# Patient Record
Sex: Female | Born: 1950 | Race: White | Hispanic: No | Smoking: Former smoker
Health system: Southern US, Community
[De-identification: ages and names within clinical notes are randomized; demographics above are authoritative.]

## PROBLEM LIST (undated history)

## (undated) DIAGNOSIS — J45909 Unspecified asthma, uncomplicated: Secondary | ICD-10-CM

## (undated) DIAGNOSIS — F329 Major depressive disorder, single episode, unspecified: Secondary | ICD-10-CM

## (undated) DIAGNOSIS — F32A Depression, unspecified: Secondary | ICD-10-CM

## (undated) DIAGNOSIS — I10 Essential (primary) hypertension: Secondary | ICD-10-CM

## (undated) HISTORY — PX: PARTIAL HYSTERECTOMY: SHX80

## (undated) HISTORY — PX: OTHER SURGICAL HISTORY: SHX169

## (undated) HISTORY — PX: ANKLE SURGERY: SHX546

---

## 1997-05-03 ENCOUNTER — Encounter: Admission: RE | Admit: 1997-05-03 | Discharge: 1997-05-03 | Payer: Self-pay | Admitting: Family Medicine

## 1997-05-18 ENCOUNTER — Encounter: Admission: RE | Admit: 1997-05-18 | Discharge: 1997-05-18 | Payer: Self-pay | Admitting: Family Medicine

## 1997-06-02 ENCOUNTER — Encounter: Admission: RE | Admit: 1997-06-02 | Discharge: 1997-06-02 | Payer: Self-pay | Admitting: Family Medicine

## 1997-09-22 ENCOUNTER — Encounter: Admission: RE | Admit: 1997-09-22 | Discharge: 1997-09-22 | Payer: Self-pay | Admitting: Family Medicine

## 1997-09-23 ENCOUNTER — Emergency Department (HOSPITAL_COMMUNITY): Admission: EM | Admit: 1997-09-23 | Discharge: 1997-09-23 | Payer: Self-pay | Admitting: Emergency Medicine

## 1997-09-23 ENCOUNTER — Encounter: Payer: Self-pay | Admitting: Emergency Medicine

## 1997-10-01 ENCOUNTER — Encounter: Admission: RE | Admit: 1997-10-01 | Discharge: 1997-10-01 | Payer: Self-pay | Admitting: Family Medicine

## 1997-11-11 ENCOUNTER — Encounter: Admission: RE | Admit: 1997-11-11 | Discharge: 1997-11-11 | Payer: Self-pay | Admitting: Family Medicine

## 1997-12-03 ENCOUNTER — Encounter: Admission: RE | Admit: 1997-12-03 | Discharge: 1997-12-03 | Payer: Self-pay | Admitting: Family Medicine

## 1997-12-22 ENCOUNTER — Encounter: Admission: RE | Admit: 1997-12-22 | Discharge: 1997-12-22 | Payer: Self-pay | Admitting: Family Medicine

## 1998-01-05 ENCOUNTER — Encounter: Admission: RE | Admit: 1998-01-05 | Discharge: 1998-01-05 | Payer: Self-pay | Admitting: Sports Medicine

## 1998-01-09 ENCOUNTER — Emergency Department (HOSPITAL_COMMUNITY): Admission: EM | Admit: 1998-01-09 | Discharge: 1998-01-09 | Payer: Self-pay | Admitting: Emergency Medicine

## 1998-01-09 ENCOUNTER — Encounter: Payer: Self-pay | Admitting: Emergency Medicine

## 1998-01-10 ENCOUNTER — Emergency Department (HOSPITAL_COMMUNITY): Admission: EM | Admit: 1998-01-10 | Discharge: 1998-01-10 | Payer: Self-pay | Admitting: Emergency Medicine

## 1998-01-11 ENCOUNTER — Encounter: Admission: RE | Admit: 1998-01-11 | Discharge: 1998-01-11 | Payer: Self-pay | Admitting: Family Medicine

## 1998-01-12 ENCOUNTER — Emergency Department (HOSPITAL_COMMUNITY): Admission: EM | Admit: 1998-01-12 | Discharge: 1998-01-12 | Payer: Self-pay | Admitting: Internal Medicine

## 1998-01-21 ENCOUNTER — Encounter: Admission: RE | Admit: 1998-01-21 | Discharge: 1998-01-21 | Payer: Self-pay | Admitting: Family Medicine

## 1998-02-04 ENCOUNTER — Encounter: Admission: RE | Admit: 1998-02-04 | Discharge: 1998-02-04 | Payer: Self-pay | Admitting: Family Medicine

## 1998-02-16 ENCOUNTER — Encounter: Admission: RE | Admit: 1998-02-16 | Discharge: 1998-02-16 | Payer: Self-pay | Admitting: Family Medicine

## 1998-03-02 ENCOUNTER — Observation Stay (HOSPITAL_COMMUNITY): Admission: RE | Admit: 1998-03-02 | Discharge: 1998-03-03 | Payer: Self-pay | Admitting: General Surgery

## 1998-04-06 ENCOUNTER — Encounter: Admission: RE | Admit: 1998-04-06 | Discharge: 1998-04-06 | Payer: Self-pay | Admitting: Family Medicine

## 1998-04-15 ENCOUNTER — Encounter: Admission: RE | Admit: 1998-04-15 | Discharge: 1998-04-15 | Payer: Self-pay | Admitting: Family Medicine

## 1998-04-27 ENCOUNTER — Encounter: Payer: Self-pay | Admitting: Emergency Medicine

## 1998-04-27 ENCOUNTER — Emergency Department (HOSPITAL_COMMUNITY): Admission: EM | Admit: 1998-04-27 | Discharge: 1998-04-27 | Payer: Self-pay | Admitting: Emergency Medicine

## 1998-04-30 ENCOUNTER — Encounter: Payer: Self-pay | Admitting: Emergency Medicine

## 1998-04-30 ENCOUNTER — Emergency Department (HOSPITAL_COMMUNITY): Admission: EM | Admit: 1998-04-30 | Discharge: 1998-04-30 | Payer: Self-pay | Admitting: Emergency Medicine

## 1998-06-14 ENCOUNTER — Emergency Department (HOSPITAL_COMMUNITY): Admission: EM | Admit: 1998-06-14 | Discharge: 1998-06-14 | Payer: Self-pay | Admitting: Emergency Medicine

## 1998-06-15 ENCOUNTER — Inpatient Hospital Stay (HOSPITAL_COMMUNITY): Admission: EM | Admit: 1998-06-15 | Discharge: 1998-06-16 | Payer: Self-pay | Admitting: Emergency Medicine

## 1998-06-15 ENCOUNTER — Encounter: Payer: Self-pay | Admitting: Surgery

## 1998-06-15 ENCOUNTER — Encounter: Payer: Self-pay | Admitting: Emergency Medicine

## 1998-07-07 ENCOUNTER — Encounter: Admission: RE | Admit: 1998-07-07 | Discharge: 1998-07-07 | Payer: Self-pay | Admitting: Family Medicine

## 1998-07-26 ENCOUNTER — Encounter: Payer: Self-pay | Admitting: Emergency Medicine

## 1998-07-26 ENCOUNTER — Emergency Department (HOSPITAL_COMMUNITY): Admission: EM | Admit: 1998-07-26 | Discharge: 1998-07-26 | Payer: Self-pay

## 1998-09-27 ENCOUNTER — Encounter: Admission: RE | Admit: 1998-09-27 | Discharge: 1998-09-27 | Payer: Self-pay | Admitting: Family Medicine

## 1998-10-11 ENCOUNTER — Encounter: Admission: RE | Admit: 1998-10-11 | Discharge: 1998-10-11 | Payer: Self-pay | Admitting: Family Medicine

## 1999-04-26 ENCOUNTER — Encounter: Admission: RE | Admit: 1999-04-26 | Discharge: 1999-04-26 | Payer: Self-pay | Admitting: Family Medicine

## 1999-05-10 ENCOUNTER — Encounter: Admission: RE | Admit: 1999-05-10 | Discharge: 1999-05-10 | Payer: Self-pay | Admitting: Family Medicine

## 1999-05-28 ENCOUNTER — Emergency Department (HOSPITAL_COMMUNITY): Admission: EM | Admit: 1999-05-28 | Discharge: 1999-05-28 | Payer: Self-pay | Admitting: Emergency Medicine

## 1999-06-27 ENCOUNTER — Encounter: Admission: RE | Admit: 1999-06-27 | Discharge: 1999-06-27 | Payer: Self-pay | Admitting: Sports Medicine

## 1999-06-30 ENCOUNTER — Encounter: Admission: RE | Admit: 1999-06-30 | Discharge: 1999-06-30 | Payer: Self-pay | Admitting: Sports Medicine

## 1999-07-12 ENCOUNTER — Encounter: Admission: RE | Admit: 1999-07-12 | Discharge: 1999-07-12 | Payer: Self-pay | Admitting: Family Medicine

## 1999-07-20 ENCOUNTER — Encounter: Admission: RE | Admit: 1999-07-20 | Discharge: 1999-07-20 | Payer: Self-pay | Admitting: Family Medicine

## 1999-08-09 ENCOUNTER — Encounter: Admission: RE | Admit: 1999-08-09 | Discharge: 1999-08-09 | Payer: Self-pay | Admitting: Family Medicine

## 1999-08-11 ENCOUNTER — Encounter: Admission: RE | Admit: 1999-08-11 | Discharge: 1999-08-11 | Payer: Self-pay | Admitting: Family Medicine

## 1999-08-23 ENCOUNTER — Encounter: Admission: RE | Admit: 1999-08-23 | Discharge: 1999-08-23 | Payer: Self-pay | Admitting: Family Medicine

## 1999-09-14 ENCOUNTER — Encounter: Admission: RE | Admit: 1999-09-14 | Discharge: 1999-09-14 | Payer: Self-pay | Admitting: Family Medicine

## 1999-10-11 ENCOUNTER — Encounter: Admission: RE | Admit: 1999-10-11 | Discharge: 1999-10-11 | Payer: Self-pay | Admitting: Family Medicine

## 1999-10-25 ENCOUNTER — Encounter: Admission: RE | Admit: 1999-10-25 | Discharge: 1999-10-25 | Payer: Self-pay | Admitting: Family Medicine

## 1999-10-30 ENCOUNTER — Encounter: Admission: RE | Admit: 1999-10-30 | Discharge: 1999-10-30 | Payer: Self-pay | Admitting: Family Medicine

## 1999-10-30 ENCOUNTER — Encounter: Payer: Self-pay | Admitting: Family Medicine

## 1999-11-27 ENCOUNTER — Encounter: Admission: RE | Admit: 1999-11-27 | Discharge: 1999-11-27 | Payer: Self-pay | Admitting: Sports Medicine

## 2000-01-29 ENCOUNTER — Encounter: Admission: RE | Admit: 2000-01-29 | Discharge: 2000-01-29 | Payer: Self-pay | Admitting: Family Medicine

## 2000-02-02 ENCOUNTER — Ambulatory Visit (HOSPITAL_COMMUNITY): Admission: RE | Admit: 2000-02-02 | Discharge: 2000-02-02 | Payer: Self-pay

## 2000-02-27 ENCOUNTER — Encounter: Admission: RE | Admit: 2000-02-27 | Discharge: 2000-02-27 | Payer: Self-pay | Admitting: Family Medicine

## 2000-03-12 ENCOUNTER — Encounter: Admission: RE | Admit: 2000-03-12 | Discharge: 2000-03-12 | Payer: Self-pay | Admitting: Sports Medicine

## 2000-03-28 ENCOUNTER — Encounter: Admission: RE | Admit: 2000-03-28 | Discharge: 2000-03-28 | Payer: Self-pay | Admitting: Family Medicine

## 2000-04-19 ENCOUNTER — Encounter: Admission: RE | Admit: 2000-04-19 | Discharge: 2000-04-19 | Payer: Self-pay | Admitting: Family Medicine

## 2000-07-04 ENCOUNTER — Encounter: Admission: RE | Admit: 2000-07-04 | Discharge: 2000-07-04 | Payer: Self-pay | Admitting: Family Medicine

## 2000-07-24 ENCOUNTER — Encounter: Admission: RE | Admit: 2000-07-24 | Discharge: 2000-07-24 | Payer: Self-pay | Admitting: Family Medicine

## 2000-08-13 ENCOUNTER — Encounter: Admission: RE | Admit: 2000-08-13 | Discharge: 2000-08-13 | Payer: Self-pay | Admitting: Family Medicine

## 2000-08-29 ENCOUNTER — Encounter: Admission: RE | Admit: 2000-08-29 | Discharge: 2000-08-29 | Payer: Self-pay | Admitting: Family Medicine

## 2000-09-23 ENCOUNTER — Encounter: Admission: RE | Admit: 2000-09-23 | Discharge: 2000-09-23 | Payer: Self-pay | Admitting: Family Medicine

## 2000-10-10 ENCOUNTER — Encounter: Admission: RE | Admit: 2000-10-10 | Discharge: 2000-10-10 | Payer: Self-pay | Admitting: Sports Medicine

## 2000-10-23 ENCOUNTER — Encounter: Admission: RE | Admit: 2000-10-23 | Discharge: 2000-10-23 | Payer: Self-pay | Admitting: Family Medicine

## 2000-10-29 ENCOUNTER — Encounter: Admission: RE | Admit: 2000-10-29 | Discharge: 2000-10-29 | Payer: Self-pay | Admitting: Sports Medicine

## 2000-11-18 ENCOUNTER — Encounter: Admission: RE | Admit: 2000-11-18 | Discharge: 2000-11-18 | Payer: Self-pay | Admitting: Sports Medicine

## 2000-12-02 ENCOUNTER — Encounter: Admission: RE | Admit: 2000-12-02 | Discharge: 2000-12-02 | Payer: Self-pay | Admitting: Sports Medicine

## 2001-01-01 ENCOUNTER — Encounter: Admission: RE | Admit: 2001-01-01 | Discharge: 2001-01-01 | Payer: Self-pay | Admitting: Family Medicine

## 2001-02-03 ENCOUNTER — Encounter: Payer: Self-pay | Admitting: Sports Medicine

## 2001-02-03 ENCOUNTER — Encounter: Admission: RE | Admit: 2001-02-03 | Discharge: 2001-02-03 | Payer: Self-pay | Admitting: Family Medicine

## 2001-02-03 ENCOUNTER — Encounter: Admission: RE | Admit: 2001-02-03 | Discharge: 2001-02-03 | Payer: Self-pay | Admitting: Sports Medicine

## 2001-02-26 ENCOUNTER — Encounter: Admission: RE | Admit: 2001-02-26 | Discharge: 2001-02-26 | Payer: Self-pay | Admitting: Family Medicine

## 2001-03-28 ENCOUNTER — Encounter: Admission: RE | Admit: 2001-03-28 | Discharge: 2001-03-28 | Payer: Self-pay | Admitting: Family Medicine

## 2001-04-30 ENCOUNTER — Encounter: Admission: RE | Admit: 2001-04-30 | Discharge: 2001-04-30 | Payer: Self-pay | Admitting: Family Medicine

## 2001-05-19 ENCOUNTER — Encounter: Admission: RE | Admit: 2001-05-19 | Discharge: 2001-05-19 | Payer: Self-pay | Admitting: Family Medicine

## 2001-06-12 ENCOUNTER — Encounter: Admission: RE | Admit: 2001-06-12 | Discharge: 2001-06-12 | Payer: Self-pay | Admitting: Family Medicine

## 2001-09-25 ENCOUNTER — Encounter: Admission: RE | Admit: 2001-09-25 | Discharge: 2001-09-25 | Payer: Self-pay | Admitting: Family Medicine

## 2001-10-23 ENCOUNTER — Encounter: Admission: RE | Admit: 2001-10-23 | Discharge: 2001-10-23 | Payer: Self-pay | Admitting: Family Medicine

## 2001-10-28 ENCOUNTER — Encounter: Admission: RE | Admit: 2001-10-28 | Discharge: 2001-10-28 | Payer: Self-pay | Admitting: Sports Medicine

## 2001-11-06 ENCOUNTER — Encounter: Admission: RE | Admit: 2001-11-06 | Discharge: 2001-11-06 | Payer: Self-pay | Admitting: Family Medicine

## 2001-11-12 ENCOUNTER — Encounter: Admission: RE | Admit: 2001-11-12 | Discharge: 2001-11-12 | Payer: Self-pay | Admitting: Family Medicine

## 2001-11-14 ENCOUNTER — Encounter: Admission: RE | Admit: 2001-11-14 | Discharge: 2001-11-14 | Payer: Self-pay | Admitting: Family Medicine

## 2001-11-24 ENCOUNTER — Encounter: Admission: RE | Admit: 2001-11-24 | Discharge: 2001-11-24 | Payer: Self-pay | Admitting: Family Medicine

## 2002-01-16 ENCOUNTER — Encounter: Admission: RE | Admit: 2002-01-16 | Discharge: 2002-01-16 | Payer: Self-pay | Admitting: Family Medicine

## 2002-03-19 ENCOUNTER — Encounter: Admission: RE | Admit: 2002-03-19 | Discharge: 2002-03-19 | Payer: Self-pay | Admitting: Family Medicine

## 2002-06-18 ENCOUNTER — Encounter: Admission: RE | Admit: 2002-06-18 | Discharge: 2002-06-18 | Payer: Self-pay | Admitting: Sports Medicine

## 2002-07-03 ENCOUNTER — Encounter: Admission: RE | Admit: 2002-07-03 | Discharge: 2002-07-03 | Payer: Self-pay | Admitting: Family Medicine

## 2002-07-09 ENCOUNTER — Encounter: Admission: RE | Admit: 2002-07-09 | Discharge: 2002-07-09 | Payer: Self-pay | Admitting: Family Medicine

## 2002-07-21 ENCOUNTER — Encounter: Admission: RE | Admit: 2002-07-21 | Discharge: 2002-07-21 | Payer: Self-pay | Admitting: Family Medicine

## 2002-08-07 ENCOUNTER — Encounter: Admission: RE | Admit: 2002-08-07 | Discharge: 2002-08-07 | Payer: Self-pay | Admitting: Sports Medicine

## 2002-09-07 ENCOUNTER — Encounter: Admission: RE | Admit: 2002-09-07 | Discharge: 2002-09-07 | Payer: Self-pay | Admitting: Sports Medicine

## 2002-09-25 ENCOUNTER — Encounter: Admission: RE | Admit: 2002-09-25 | Discharge: 2002-09-25 | Payer: Self-pay | Admitting: Family Medicine

## 2002-10-09 ENCOUNTER — Encounter: Admission: RE | Admit: 2002-10-09 | Discharge: 2002-10-09 | Payer: Self-pay | Admitting: Family Medicine

## 2002-10-19 ENCOUNTER — Ambulatory Visit (HOSPITAL_COMMUNITY): Admission: RE | Admit: 2002-10-19 | Discharge: 2002-10-19 | Payer: Self-pay | Admitting: Family Medicine

## 2002-11-20 ENCOUNTER — Encounter: Admission: RE | Admit: 2002-11-20 | Discharge: 2002-11-20 | Payer: Self-pay | Admitting: Family Medicine

## 2002-12-21 ENCOUNTER — Ambulatory Visit (HOSPITAL_COMMUNITY): Admission: RE | Admit: 2002-12-21 | Discharge: 2002-12-21 | Payer: Self-pay | Admitting: Family Medicine

## 2002-12-21 ENCOUNTER — Encounter: Admission: RE | Admit: 2002-12-21 | Discharge: 2002-12-21 | Payer: Self-pay | Admitting: Family Medicine

## 2003-01-06 ENCOUNTER — Encounter: Admission: RE | Admit: 2003-01-06 | Discharge: 2003-01-06 | Payer: Self-pay | Admitting: Sports Medicine

## 2003-01-19 ENCOUNTER — Encounter: Admission: RE | Admit: 2003-01-19 | Discharge: 2003-01-19 | Payer: Self-pay | Admitting: Sports Medicine

## 2003-02-02 ENCOUNTER — Ambulatory Visit (HOSPITAL_COMMUNITY): Admission: RE | Admit: 2003-02-02 | Discharge: 2003-02-02 | Payer: Self-pay | Admitting: Sports Medicine

## 2003-02-03 ENCOUNTER — Encounter: Admission: RE | Admit: 2003-02-03 | Discharge: 2003-02-03 | Payer: Self-pay | Admitting: Family Medicine

## 2003-02-18 ENCOUNTER — Ambulatory Visit (HOSPITAL_COMMUNITY): Admission: RE | Admit: 2003-02-18 | Discharge: 2003-02-18 | Payer: Self-pay | Admitting: Cardiology

## 2003-02-22 ENCOUNTER — Encounter: Admission: RE | Admit: 2003-02-22 | Discharge: 2003-02-22 | Payer: Self-pay | Admitting: Family Medicine

## 2003-02-25 ENCOUNTER — Encounter: Admission: RE | Admit: 2003-02-25 | Discharge: 2003-02-25 | Payer: Self-pay | Admitting: Sports Medicine

## 2003-03-08 ENCOUNTER — Ambulatory Visit (HOSPITAL_COMMUNITY): Admission: RE | Admit: 2003-03-08 | Discharge: 2003-03-08 | Payer: Self-pay | Admitting: Family Medicine

## 2003-04-06 ENCOUNTER — Encounter: Admission: RE | Admit: 2003-04-06 | Discharge: 2003-04-06 | Payer: Self-pay | Admitting: Family Medicine

## 2003-04-22 ENCOUNTER — Encounter: Admission: RE | Admit: 2003-04-22 | Discharge: 2003-04-22 | Payer: Self-pay | Admitting: Family Medicine

## 2003-06-03 ENCOUNTER — Encounter: Admission: RE | Admit: 2003-06-03 | Discharge: 2003-06-03 | Payer: Self-pay | Admitting: Sports Medicine

## 2003-07-05 ENCOUNTER — Encounter: Admission: RE | Admit: 2003-07-05 | Discharge: 2003-07-05 | Payer: Self-pay | Admitting: Family Medicine

## 2003-09-16 ENCOUNTER — Ambulatory Visit: Payer: Self-pay | Admitting: Sports Medicine

## 2003-10-20 ENCOUNTER — Ambulatory Visit: Payer: Self-pay | Admitting: Family Medicine

## 2003-11-02 ENCOUNTER — Ambulatory Visit: Payer: Self-pay | Admitting: Sports Medicine

## 2003-11-29 ENCOUNTER — Encounter: Admission: RE | Admit: 2003-11-29 | Discharge: 2003-11-29 | Payer: Self-pay | Admitting: Sports Medicine

## 2003-11-29 ENCOUNTER — Emergency Department: Payer: Self-pay | Admitting: Emergency Medicine

## 2003-11-29 ENCOUNTER — Ambulatory Visit: Payer: Self-pay | Admitting: Family Medicine

## 2003-12-01 ENCOUNTER — Ambulatory Visit: Payer: Self-pay | Admitting: Family Medicine

## 2004-01-12 ENCOUNTER — Ambulatory Visit: Payer: Self-pay | Admitting: Sports Medicine

## 2004-01-28 ENCOUNTER — Ambulatory Visit: Payer: Self-pay | Admitting: Sports Medicine

## 2004-02-04 ENCOUNTER — Ambulatory Visit: Payer: Self-pay | Admitting: Sports Medicine

## 2004-02-17 ENCOUNTER — Ambulatory Visit: Payer: Self-pay | Admitting: Family Medicine

## 2004-02-24 ENCOUNTER — Ambulatory Visit: Payer: Self-pay | Admitting: Family Medicine

## 2004-03-09 ENCOUNTER — Ambulatory Visit: Payer: Self-pay | Admitting: Family Medicine

## 2004-03-29 ENCOUNTER — Ambulatory Visit: Payer: Self-pay | Admitting: Family Medicine

## 2004-03-30 ENCOUNTER — Ambulatory Visit: Payer: Self-pay | Admitting: Family Medicine

## 2004-04-14 ENCOUNTER — Ambulatory Visit: Payer: Self-pay | Admitting: Family Medicine

## 2004-05-01 ENCOUNTER — Ambulatory Visit: Payer: Self-pay | Admitting: Family Medicine

## 2004-07-21 ENCOUNTER — Ambulatory Visit: Payer: Self-pay | Admitting: Family Medicine

## 2004-08-09 ENCOUNTER — Ambulatory Visit: Payer: Self-pay | Admitting: Family Medicine

## 2004-08-28 ENCOUNTER — Ambulatory Visit (HOSPITAL_COMMUNITY): Admission: RE | Admit: 2004-08-28 | Discharge: 2004-08-28 | Payer: Self-pay | Admitting: Sports Medicine

## 2004-10-13 ENCOUNTER — Ambulatory Visit: Payer: Self-pay | Admitting: Family Medicine

## 2004-11-23 ENCOUNTER — Ambulatory Visit: Payer: Self-pay | Admitting: Family Medicine

## 2005-01-17 ENCOUNTER — Ambulatory Visit: Payer: Self-pay | Admitting: Family Medicine

## 2005-01-22 ENCOUNTER — Ambulatory Visit: Payer: Self-pay | Admitting: Family Medicine

## 2005-02-07 ENCOUNTER — Ambulatory Visit: Payer: Self-pay | Admitting: Family Medicine

## 2005-06-14 ENCOUNTER — Ambulatory Visit: Payer: Self-pay | Admitting: Family Medicine

## 2005-06-14 ENCOUNTER — Encounter: Admission: RE | Admit: 2005-06-14 | Discharge: 2005-06-14 | Payer: Self-pay | Admitting: Sports Medicine

## 2005-06-20 ENCOUNTER — Ambulatory Visit: Payer: Self-pay | Admitting: Family Medicine

## 2005-07-09 ENCOUNTER — Ambulatory Visit: Payer: Self-pay | Admitting: Family Medicine

## 2005-09-10 ENCOUNTER — Ambulatory Visit: Payer: Self-pay | Admitting: Family Medicine

## 2005-10-24 ENCOUNTER — Ambulatory Visit: Payer: Self-pay | Admitting: Sports Medicine

## 2006-01-03 ENCOUNTER — Ambulatory Visit: Payer: Self-pay | Admitting: Sports Medicine

## 2006-01-15 ENCOUNTER — Encounter (INDEPENDENT_AMBULATORY_CARE_PROVIDER_SITE_OTHER): Payer: Self-pay | Admitting: *Deleted

## 2006-01-15 LAB — CONVERTED CEMR LAB

## 2006-02-11 ENCOUNTER — Encounter (INDEPENDENT_AMBULATORY_CARE_PROVIDER_SITE_OTHER): Payer: Self-pay | Admitting: Family Medicine

## 2006-02-11 ENCOUNTER — Ambulatory Visit: Payer: Self-pay | Admitting: Sports Medicine

## 2006-02-14 ENCOUNTER — Ambulatory Visit (HOSPITAL_COMMUNITY): Admission: RE | Admit: 2006-02-14 | Discharge: 2006-02-14 | Payer: Self-pay | Admitting: Sports Medicine

## 2006-03-14 DIAGNOSIS — K219 Gastro-esophageal reflux disease without esophagitis: Secondary | ICD-10-CM | POA: Insufficient documentation

## 2006-03-14 DIAGNOSIS — F411 Generalized anxiety disorder: Secondary | ICD-10-CM | POA: Insufficient documentation

## 2006-03-14 DIAGNOSIS — E669 Obesity, unspecified: Secondary | ICD-10-CM

## 2006-03-14 DIAGNOSIS — M129 Arthropathy, unspecified: Secondary | ICD-10-CM | POA: Insufficient documentation

## 2006-03-14 DIAGNOSIS — E7800 Pure hypercholesterolemia, unspecified: Secondary | ICD-10-CM | POA: Insufficient documentation

## 2006-03-14 DIAGNOSIS — K449 Diaphragmatic hernia without obstruction or gangrene: Secondary | ICD-10-CM | POA: Insufficient documentation

## 2006-03-14 DIAGNOSIS — J45909 Unspecified asthma, uncomplicated: Secondary | ICD-10-CM | POA: Insufficient documentation

## 2006-03-14 DIAGNOSIS — F339 Major depressive disorder, recurrent, unspecified: Secondary | ICD-10-CM | POA: Insufficient documentation

## 2006-03-14 DIAGNOSIS — I10 Essential (primary) hypertension: Secondary | ICD-10-CM | POA: Insufficient documentation

## 2006-03-14 DIAGNOSIS — F5104 Psychophysiologic insomnia: Secondary | ICD-10-CM | POA: Insufficient documentation

## 2006-03-14 DIAGNOSIS — G4700 Insomnia, unspecified: Secondary | ICD-10-CM

## 2006-03-15 ENCOUNTER — Encounter (INDEPENDENT_AMBULATORY_CARE_PROVIDER_SITE_OTHER): Payer: Self-pay | Admitting: *Deleted

## 2006-03-15 ENCOUNTER — Ambulatory Visit: Payer: Self-pay | Admitting: Family Medicine

## 2006-04-19 ENCOUNTER — Encounter (INDEPENDENT_AMBULATORY_CARE_PROVIDER_SITE_OTHER): Payer: Self-pay | Admitting: Family Medicine

## 2006-04-23 ENCOUNTER — Encounter (INDEPENDENT_AMBULATORY_CARE_PROVIDER_SITE_OTHER): Payer: Self-pay | Admitting: Family Medicine

## 2006-05-13 ENCOUNTER — Telehealth: Payer: Self-pay | Admitting: *Deleted

## 2006-05-14 ENCOUNTER — Ambulatory Visit: Payer: Self-pay | Admitting: Family Medicine

## 2006-05-14 ENCOUNTER — Ambulatory Visit (HOSPITAL_COMMUNITY): Admission: RE | Admit: 2006-05-14 | Discharge: 2006-05-14 | Payer: Self-pay | Admitting: Family Medicine

## 2006-05-22 ENCOUNTER — Telehealth: Payer: Self-pay | Admitting: *Deleted

## 2006-06-04 ENCOUNTER — Encounter (INDEPENDENT_AMBULATORY_CARE_PROVIDER_SITE_OTHER): Payer: Self-pay | Admitting: Family Medicine

## 2006-06-20 ENCOUNTER — Ambulatory Visit: Payer: Self-pay | Admitting: Family Medicine

## 2006-06-20 ENCOUNTER — Encounter (INDEPENDENT_AMBULATORY_CARE_PROVIDER_SITE_OTHER): Payer: Self-pay | Admitting: Family Medicine

## 2006-06-20 LAB — CONVERTED CEMR LAB
ALT: 19 units/L (ref 0–35)
AST: 14 units/L (ref 0–37)
Albumin: 4.2 g/dL (ref 3.5–5.2)
Alkaline Phosphatase: 108 units/L (ref 39–117)
BUN: 14 mg/dL (ref 6–23)
CO2: 25 meq/L (ref 19–32)
Calcium: 9.3 mg/dL (ref 8.4–10.5)
Chloride: 104 meq/L (ref 96–112)
Cholesterol: 150 mg/dL (ref 0–200)
Creatinine, Ser: 0.57 mg/dL (ref 0.40–1.20)
Glucose, Bld: 87 mg/dL (ref 70–99)
HDL: 42 mg/dL (ref >39)
LDL Cholesterol: 77 mg/dL (ref 0–99)
Potassium: 4.2 meq/L (ref 3.5–5.3)
Sodium: 139 meq/L (ref 135–145)
Total Bilirubin: 0.4 mg/dL (ref 0.3–1.2)
Total CHOL/HDL Ratio: 3.6
Total Protein: 7.2 g/dL (ref 6.0–8.3)
Triglycerides: 153 mg/dL — ABNORMAL HIGH (ref <150)
VLDL: 31 mg/dL (ref 0–40)

## 2006-06-25 ENCOUNTER — Encounter (INDEPENDENT_AMBULATORY_CARE_PROVIDER_SITE_OTHER): Payer: Self-pay | Admitting: Family Medicine

## 2006-06-26 ENCOUNTER — Encounter (INDEPENDENT_AMBULATORY_CARE_PROVIDER_SITE_OTHER): Payer: Self-pay | Admitting: Family Medicine

## 2006-07-23 ENCOUNTER — Encounter (INDEPENDENT_AMBULATORY_CARE_PROVIDER_SITE_OTHER): Payer: Self-pay | Admitting: Family Medicine

## 2006-08-23 ENCOUNTER — Encounter: Payer: Self-pay | Admitting: *Deleted

## 2006-08-28 ENCOUNTER — Ambulatory Visit: Payer: Self-pay | Admitting: Family Medicine

## 2006-09-17 ENCOUNTER — Encounter (INDEPENDENT_AMBULATORY_CARE_PROVIDER_SITE_OTHER): Payer: Self-pay | Admitting: Family Medicine

## 2006-09-27 ENCOUNTER — Encounter: Payer: Self-pay | Admitting: *Deleted

## 2006-10-01 ENCOUNTER — Encounter (INDEPENDENT_AMBULATORY_CARE_PROVIDER_SITE_OTHER): Payer: Self-pay | Admitting: Family Medicine

## 2006-11-18 ENCOUNTER — Telehealth (INDEPENDENT_AMBULATORY_CARE_PROVIDER_SITE_OTHER): Payer: Self-pay | Admitting: Family Medicine

## 2006-12-17 ENCOUNTER — Encounter (INDEPENDENT_AMBULATORY_CARE_PROVIDER_SITE_OTHER): Payer: Self-pay | Admitting: Family Medicine

## 2006-12-17 ENCOUNTER — Ambulatory Visit: Payer: Self-pay | Admitting: Family Medicine

## 2006-12-17 LAB — CONVERTED CEMR LAB
ALT: 21 U/L
AST: 18 U/L
Albumin: 4 g/dL
Alkaline Phosphatase: 105 U/L
BUN: 15 mg/dL
CO2: 28 meq/L
Calcium: 9.3 mg/dL
Chloride: 101 meq/L
Creatinine, Ser: 0.63 mg/dL
Glucose, Bld: 103 mg/dL — ABNORMAL HIGH
Potassium: 3.8 meq/L
Sodium: 142 meq/L
Total Bilirubin: 0.3 mg/dL
Total Protein: 6.7 g/dL

## 2006-12-24 ENCOUNTER — Encounter (INDEPENDENT_AMBULATORY_CARE_PROVIDER_SITE_OTHER): Payer: Self-pay | Admitting: Family Medicine

## 2006-12-31 ENCOUNTER — Encounter (INDEPENDENT_AMBULATORY_CARE_PROVIDER_SITE_OTHER): Payer: Self-pay | Admitting: Family Medicine

## 2007-01-23 ENCOUNTER — Ambulatory Visit: Payer: Self-pay | Admitting: Family Medicine

## 2007-01-23 LAB — CONVERTED CEMR LAB: Rapid Strep: NEGATIVE

## 2007-02-26 ENCOUNTER — Ambulatory Visit (HOSPITAL_COMMUNITY): Admission: RE | Admit: 2007-02-26 | Discharge: 2007-02-26 | Payer: Self-pay | Admitting: Family Medicine

## 2007-02-26 ENCOUNTER — Ambulatory Visit: Payer: Self-pay | Admitting: Family Medicine

## 2007-03-26 ENCOUNTER — Ambulatory Visit: Payer: Self-pay | Admitting: Family Medicine

## 2007-03-31 ENCOUNTER — Encounter (INDEPENDENT_AMBULATORY_CARE_PROVIDER_SITE_OTHER): Payer: Self-pay | Admitting: Family Medicine

## 2007-04-09 ENCOUNTER — Telehealth (INDEPENDENT_AMBULATORY_CARE_PROVIDER_SITE_OTHER): Payer: Self-pay | Admitting: Family Medicine

## 2007-04-11 ENCOUNTER — Telehealth: Payer: Self-pay | Admitting: *Deleted

## 2007-04-16 ENCOUNTER — Encounter (INDEPENDENT_AMBULATORY_CARE_PROVIDER_SITE_OTHER): Payer: Self-pay | Admitting: Family Medicine

## 2007-04-23 ENCOUNTER — Encounter (INDEPENDENT_AMBULATORY_CARE_PROVIDER_SITE_OTHER): Payer: Self-pay | Admitting: Family Medicine

## 2007-04-28 ENCOUNTER — Ambulatory Visit: Payer: Self-pay | Admitting: Family Medicine

## 2007-04-28 DIAGNOSIS — J309 Allergic rhinitis, unspecified: Secondary | ICD-10-CM | POA: Insufficient documentation

## 2007-06-17 ENCOUNTER — Ambulatory Visit (HOSPITAL_COMMUNITY): Admission: RE | Admit: 2007-06-17 | Discharge: 2007-06-17 | Payer: Self-pay | Admitting: Family Medicine

## 2007-06-25 ENCOUNTER — Telehealth (INDEPENDENT_AMBULATORY_CARE_PROVIDER_SITE_OTHER): Payer: Self-pay | Admitting: Family Medicine

## 2007-06-26 ENCOUNTER — Encounter: Admission: RE | Admit: 2007-06-26 | Discharge: 2007-06-26 | Payer: Self-pay | Admitting: Family Medicine

## 2007-07-07 ENCOUNTER — Encounter (INDEPENDENT_AMBULATORY_CARE_PROVIDER_SITE_OTHER): Payer: Self-pay | Admitting: Family Medicine

## 2007-07-23 ENCOUNTER — Telehealth (INDEPENDENT_AMBULATORY_CARE_PROVIDER_SITE_OTHER): Payer: Self-pay | Admitting: *Deleted

## 2007-07-23 ENCOUNTER — Ambulatory Visit: Payer: Self-pay | Admitting: Family Medicine

## 2007-08-29 ENCOUNTER — Telehealth (INDEPENDENT_AMBULATORY_CARE_PROVIDER_SITE_OTHER): Payer: Self-pay | Admitting: Family Medicine

## 2007-09-05 ENCOUNTER — Ambulatory Visit: Payer: Self-pay | Admitting: Family Medicine

## 2007-09-15 ENCOUNTER — Encounter (INDEPENDENT_AMBULATORY_CARE_PROVIDER_SITE_OTHER): Payer: Self-pay | Admitting: Family Medicine

## 2007-09-18 ENCOUNTER — Telehealth: Payer: Self-pay | Admitting: *Deleted

## 2007-09-19 ENCOUNTER — Ambulatory Visit: Payer: Self-pay | Admitting: Family Medicine

## 2007-10-28 ENCOUNTER — Telehealth: Payer: Self-pay | Admitting: *Deleted

## 2007-11-18 ENCOUNTER — Ambulatory Visit: Payer: Self-pay | Admitting: Family Medicine

## 2008-01-22 ENCOUNTER — Telehealth (INDEPENDENT_AMBULATORY_CARE_PROVIDER_SITE_OTHER): Payer: Self-pay | Admitting: *Deleted

## 2008-04-23 ENCOUNTER — Encounter (INDEPENDENT_AMBULATORY_CARE_PROVIDER_SITE_OTHER): Payer: Self-pay | Admitting: Family Medicine

## 2008-06-23 ENCOUNTER — Encounter (INDEPENDENT_AMBULATORY_CARE_PROVIDER_SITE_OTHER): Payer: Self-pay | Admitting: Family Medicine

## 2008-07-26 ENCOUNTER — Telehealth: Payer: Self-pay | Admitting: Family Medicine

## 2008-07-28 ENCOUNTER — Encounter: Payer: Self-pay | Admitting: Family Medicine

## 2008-07-28 ENCOUNTER — Ambulatory Visit: Payer: Self-pay | Admitting: Family Medicine

## 2008-10-18 ENCOUNTER — Ambulatory Visit: Payer: Self-pay | Admitting: Family Medicine

## 2008-10-18 ENCOUNTER — Encounter: Payer: Self-pay | Admitting: Family Medicine

## 2008-10-18 LAB — CONVERTED CEMR LAB
ALT: 17 units/L (ref 0–35)
AST: 15 units/L (ref 0–37)
Albumin: 4.4 g/dL (ref 3.5–5.2)
Alkaline Phosphatase: 95 units/L (ref 39–117)
BUN: 15 mg/dL (ref 6–23)
CO2: 23 meq/L (ref 19–32)
Calcium: 10.2 mg/dL (ref 8.4–10.5)
Chloride: 100 meq/L (ref 96–112)
Cholesterol: 175 mg/dL (ref 0–200)
Creatinine, Ser: 0.62 mg/dL (ref 0.40–1.20)
Glucose, Bld: 89 mg/dL (ref 70–99)
HDL: 44 mg/dL (ref >39)
LDL Cholesterol: 86 mg/dL (ref 0–99)
Potassium: 4.1 meq/L (ref 3.5–5.3)
Sodium: 139 meq/L (ref 135–145)
TSH: 2.909 microintl units/mL (ref 0.350–4.500)
Total Bilirubin: 0.5 mg/dL (ref 0.3–1.2)
Total CHOL/HDL Ratio: 4
Total Protein: 6.8 g/dL (ref 6.0–8.3)
Triglycerides: 226 mg/dL — ABNORMAL HIGH (ref <150)
VLDL: 45 mg/dL — ABNORMAL HIGH (ref 0–40)

## 2008-10-19 ENCOUNTER — Encounter: Payer: Self-pay | Admitting: Family Medicine

## 2008-12-22 ENCOUNTER — Emergency Department: Payer: Self-pay

## 2009-02-23 ENCOUNTER — Encounter: Payer: Self-pay | Admitting: Family Medicine

## 2009-03-16 ENCOUNTER — Encounter: Payer: Self-pay | Admitting: Internal Medicine

## 2009-08-23 ENCOUNTER — Encounter: Payer: Self-pay | Admitting: Sports Medicine

## 2009-08-23 ENCOUNTER — Ambulatory Visit: Payer: Self-pay | Admitting: Family Medicine

## 2009-08-23 DIAGNOSIS — G5600 Carpal tunnel syndrome, unspecified upper limb: Secondary | ICD-10-CM

## 2009-09-29 ENCOUNTER — Telehealth: Payer: Self-pay | Admitting: Sports Medicine

## 2009-11-07 ENCOUNTER — Encounter: Payer: Self-pay | Admitting: Sports Medicine

## 2009-11-21 ENCOUNTER — Encounter: Payer: Self-pay | Admitting: Sports Medicine

## 2009-12-01 ENCOUNTER — Encounter: Payer: Self-pay | Admitting: Sports Medicine

## 2010-01-23 ENCOUNTER — Encounter: Payer: Self-pay | Admitting: *Deleted

## 2010-02-14 NOTE — Miscellaneous (Signed)
  Clinical Lists Changes  Problems: Changed problem from ASTHMA, UNSPECIFIED (ICD-493.90) to ASTHMA, PERSISTENT (ICD-493.90) 

## 2010-02-14 NOTE — Progress Notes (Signed)
Summary: moved to [place]  Phone Note Call from Patient   Caller: Patient Call For: [phone] Summary of Call: Pt calling from [place] regarding change of dosage for her Paxil.  Need it to be changed to 20mg .  Cost more for the 40.   Need to call to [facility] in [place] to give verbal order for change.  Their number is [phone]. Initial call taken by: Britta Mccreedy [email or web address]  Follow-up for Phone Call        [facility] to call and change to paxil 20 mg once daily #30 refills 0. Follow-up by: Rodney Langton MD,  September 29, 2009 4:40 PM  Additional Follow-up for Phone Call Additional follow up Details #1::        per pharmacist, she did not want to change the dose just get it in 20mg  tabs & take 2 HS. this is less costly than 1 40mg  tab, ok'd one month  only. told pharmacist to tell pt she will need to find a doctor there as we cannot fill more than this from [place]. He will relay the message Additional Follow-up by: Golden Circle RN,  September 30, 2009 2:32 PM    Additional Follow-up for Phone Call Additional follow up Details #2::    Pt upset and tearful because she was only getting 1 month supply of her Paxil.  Said her original rx was for 41mos.  Provider was aware that she was moving to [place] and all of the rest of her meds were gjven 41mo to give her time to find another doctor.  She would like the doctor to call and explain why she can only have 10month after requesting change in rx.  Please call @ [phone] Follow-up by: Abundio Miu  Additional Follow-up for Phone Call Additional follow up Details #3:: Details for Additional Follow-up Action Taken: [facility] for 2 months, that will be paxil 20mg  tabs #120, refills:0 Additional Follow-up by: Rodney Langton MD,  September 30, 2009 3:41 PM  New/Updated Medications: PAXIL 20 MG TABS (PAROXETINE HCL) One tab by mouth daily I called the refill in to her pharmacy & notified the pt.Golden Circle RN  September 30, 2009 4:29 PM

## 2010-02-14 NOTE — Miscellaneous (Signed)
Summary: Pt moved to [place] and no longer part of our practice.  Patient moving to [place] and no longer part of our practice. Rodney Langton MD  December 01, 2009 11:29 AM
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
## 2010-02-14 NOTE — Progress Notes (Signed)
Summary: [facility]   Imported By: Harlon Flor 03/18/2009 15:42:16  _____________________________________________________________________  External Attachment:    Type:   Image     Comment:   External Document

## 2010-02-14 NOTE — Miscellaneous (Signed)
Summary: Procedures Consent  Procedures Consent   Imported By: De Nurse 08/24/2009 14:11:17  _____________________________________________________________________  External Attachment:    Type:   Image     Comment:   External Document

## 2010-02-14 NOTE — Miscellaneous (Signed)
  Clinical Lists Changes  Problems: Removed problem of DRUG WITHDRAWAL (ICD-292.0) Removed problem of ABNORMAL MAMMOGRAM (ICD-793.80)

## 2010-02-14 NOTE — Miscellaneous (Signed)
  Clinical Lists Changes  Medications: Removed medication of VICODIN 5-500 MG TABS (HYDROCODONE-ACETAMINOPHEN) Take 1 tablet by mouth once or twice a day as needed for pain

## 2010-03-19 ENCOUNTER — Encounter: Payer: Self-pay | Admitting: *Deleted

## 2010-11-20 ENCOUNTER — Ambulatory Visit: Payer: Self-pay | Admitting: Family Medicine

## 2011-05-20 ENCOUNTER — Other Ambulatory Visit: Payer: Self-pay | Admitting: Family Medicine

## 2011-05-20 MED ORDER — METOPROLOL TARTRATE 50 MG PO TABS: 50.0000 mg | ORAL_TABLET | Freq: Two times a day (BID) | ORAL | Status: AC

## 2011-07-04 ENCOUNTER — Ambulatory Visit: Payer: Self-pay | Admitting: Family Medicine

## 2018-01-13 ENCOUNTER — Other Ambulatory Visit: Payer: Self-pay

## 2018-01-13 ENCOUNTER — Emergency Department: Payer: Medicare Other

## 2018-01-13 ENCOUNTER — Inpatient Hospital Stay
Admission: EM | Admit: 2018-01-13 | Discharge: 2018-01-17 | DRG: 194 | Disposition: A | Discharge status: Home / Self Care | Payer: Medicare Other | Attending: Internal Medicine | Admitting: Internal Medicine

## 2018-01-13 ENCOUNTER — Encounter: Payer: Self-pay | Admitting: Emergency Medicine

## 2018-01-13 DIAGNOSIS — J4521 Mild intermittent asthma with (acute) exacerbation: Secondary | ICD-10-CM | POA: Diagnosis present

## 2018-01-13 DIAGNOSIS — Z882 Allergy status to sulfonamides status: Secondary | ICD-10-CM

## 2018-01-13 DIAGNOSIS — Z87891 Personal history of nicotine dependence: Secondary | ICD-10-CM

## 2018-01-13 DIAGNOSIS — R0902 Hypoxemia: Secondary | ICD-10-CM

## 2018-01-13 DIAGNOSIS — Z888 Allergy status to other drugs, medicaments and biological substances status: Secondary | ICD-10-CM

## 2018-01-13 DIAGNOSIS — Z79899 Other long term (current) drug therapy: Secondary | ICD-10-CM

## 2018-01-13 DIAGNOSIS — J101 Influenza due to other identified influenza virus with other respiratory manifestations: Principal | ICD-10-CM | POA: Diagnosis present

## 2018-01-13 DIAGNOSIS — Z7982 Long term (current) use of aspirin: Secondary | ICD-10-CM

## 2018-01-13 DIAGNOSIS — R062 Wheezing: Secondary | ICD-10-CM

## 2018-01-13 DIAGNOSIS — J45901 Unspecified asthma with (acute) exacerbation: Secondary | ICD-10-CM | POA: Diagnosis present

## 2018-01-13 DIAGNOSIS — I10 Essential (primary) hypertension: Secondary | ICD-10-CM | POA: Diagnosis present

## 2018-01-13 DIAGNOSIS — F329 Major depressive disorder, single episode, unspecified: Secondary | ICD-10-CM | POA: Diagnosis present

## 2018-01-13 DIAGNOSIS — Z885 Allergy status to narcotic agent status: Secondary | ICD-10-CM

## 2018-01-13 DIAGNOSIS — J111 Influenza due to unidentified influenza virus with other respiratory manifestations: Secondary | ICD-10-CM

## 2018-01-13 DIAGNOSIS — J4531 Mild persistent asthma with (acute) exacerbation: Secondary | ICD-10-CM

## 2018-01-13 DIAGNOSIS — J209 Acute bronchitis, unspecified: Secondary | ICD-10-CM | POA: Diagnosis present

## 2018-01-13 DIAGNOSIS — R0602 Shortness of breath: Secondary | ICD-10-CM | POA: Diagnosis present

## 2018-01-13 DIAGNOSIS — Z7951 Long term (current) use of inhaled steroids: Secondary | ICD-10-CM

## 2018-01-13 DIAGNOSIS — E785 Hyperlipidemia, unspecified: Secondary | ICD-10-CM | POA: Diagnosis present

## 2018-01-13 HISTORY — DX: Major depressive disorder, single episode, unspecified: F32.9

## 2018-01-13 HISTORY — DX: Essential (primary) hypertension: I10

## 2018-01-13 HISTORY — DX: Unspecified asthma, uncomplicated: J45.909

## 2018-01-13 HISTORY — DX: Depression, unspecified: F32.A

## 2018-01-13 LAB — COMPREHENSIVE METABOLIC PANEL WITH GFR
ALT: 19 U/L (ref 0–44)
AST: 24 U/L (ref 15–41)
Albumin: 4.1 g/dL (ref 3.5–5.0)
Alkaline Phosphatase: 83 U/L (ref 38–126)
Anion gap: 10 (ref 5–15)
BUN: 10 mg/dL (ref 8–23)
CO2: 25 mmol/L (ref 22–32)
Calcium: 9.3 mg/dL (ref 8.9–10.3)
Chloride: 106 mmol/L (ref 98–111)
Creatinine, Ser: 0.61 mg/dL (ref 0.44–1.00)
GFR calc Af Amer: >60 mL/min
GFR calc non Af Amer: >60 mL/min
Glucose, Bld: 142 mg/dL — ABNORMAL HIGH (ref 70–99)
Potassium: 3.2 mmol/L — ABNORMAL LOW (ref 3.5–5.1)
Sodium: 141 mmol/L (ref 135–145)
Total Bilirubin: 0.9 mg/dL (ref 0.3–1.2)
Total Protein: 7.3 g/dL (ref 6.5–8.1)

## 2018-01-13 LAB — CBC
HCT: 46.2 % — ABNORMAL HIGH (ref 36.0–46.0)
HEMOGLOBIN: 15.6 g/dL — AB (ref 12.0–15.0)
MCH: 28.7 pg (ref 26.0–34.0)
MCHC: 33.8 g/dL (ref 30.0–36.0)
MCV: 84.9 fL (ref 80.0–100.0)
Platelets: 220 10*3/uL (ref 150–400)
RBC: 5.44 MIL/uL — ABNORMAL HIGH (ref 3.87–5.11)
RDW: 12.5 % (ref 11.5–15.5)
WBC: 6.3 10*3/uL (ref 4.0–10.5)
nRBC: 0 % (ref 0.0–0.2)

## 2018-01-13 LAB — INFLUENZA PANEL BY PCR (TYPE A & B)
Influenza A By PCR: POSITIVE — AB
Influenza B By PCR: NEGATIVE

## 2018-01-13 MED ORDER — METOPROLOL TARTRATE 50 MG PO TABS
50.0000 mg | ORAL_TABLET | Freq: Two times a day (BID) | ORAL | Status: DC
  Administered 2018-01-13 – 2018-01-17 (×8): 50 mg via ORAL
  Filled 2018-01-13 (×8): qty 1

## 2018-01-13 MED ORDER — OSELTAMIVIR PHOSPHATE 75 MG PO CAPS
75.0000 mg | ORAL_CAPSULE | Freq: Two times a day (BID) | ORAL | Status: DC
  Administered 2018-01-13 – 2018-01-17 (×8): 75 mg via ORAL
  Filled 2018-01-13 (×9): qty 1

## 2018-01-13 MED ORDER — PAROXETINE HCL 20 MG PO TABS
20.0000 mg | ORAL_TABLET | Freq: Every day | ORAL | Status: DC
  Administered 2018-01-14 – 2018-01-17 (×4): 20 mg via ORAL
  Filled 2018-01-13 (×4): qty 1

## 2018-01-13 MED ORDER — IPRATROPIUM-ALBUTEROL 0.5-2.5 (3) MG/3ML IN SOLN
3.0000 mL | RESPIRATORY_TRACT | Status: DC
  Administered 2018-01-13 – 2018-01-14 (×4): 3 mL via RESPIRATORY_TRACT
  Filled 2018-01-13 (×4): qty 3

## 2018-01-13 MED ORDER — ALBUTEROL SULFATE (2.5 MG/3ML) 0.083% IN NEBU
5.0000 mg | INHALATION_SOLUTION | Freq: Once | RESPIRATORY_TRACT | Status: AC
  Administered 2018-01-13: 5 mg via RESPIRATORY_TRACT
  Filled 2018-01-13: qty 6

## 2018-01-13 MED ORDER — BUDESONIDE 0.25 MG/2ML IN SUSP
0.2500 mg | Freq: Two times a day (BID) | RESPIRATORY_TRACT | Status: DC
  Administered 2018-01-13 – 2018-01-17 (×8): 0.25 mg via RESPIRATORY_TRACT
  Filled 2018-01-13 (×8): qty 2

## 2018-01-13 MED ORDER — ENOXAPARIN SODIUM 40 MG/0.4ML ~~LOC~~ SOLN
40.0000 mg | SUBCUTANEOUS | Status: DC
  Administered 2018-01-13 – 2018-01-16 (×4): 40 mg via SUBCUTANEOUS
  Filled 2018-01-13 (×4): qty 0.4

## 2018-01-13 MED ORDER — SODIUM CHLORIDE 0.9 % IV SOLN
INTRAVENOUS | Status: DC
  Administered 2018-01-13 – 2018-01-14 (×2): via INTRAVENOUS

## 2018-01-13 MED ORDER — VITAMIN E 45 MG (100 UNIT) PO CAPS
100.0000 [IU] | ORAL_CAPSULE | Freq: Every day | ORAL | Status: DC
  Administered 2018-01-14 – 2018-01-17 (×4): 100 [IU] via ORAL
  Filled 2018-01-13 (×5): qty 1

## 2018-01-13 MED ORDER — POTASSIUM CHLORIDE CRYS ER 20 MEQ PO TBCR
40.0000 meq | EXTENDED_RELEASE_TABLET | ORAL | Status: AC
  Administered 2018-01-13 (×2): 40 meq via ORAL
  Filled 2018-01-13 (×2): qty 2

## 2018-01-13 MED ORDER — PANTOPRAZOLE SODIUM 40 MG PO TBEC
40.0000 mg | DELAYED_RELEASE_TABLET | Freq: Every day | ORAL | Status: DC
  Administered 2018-01-14 – 2018-01-17 (×4): 40 mg via ORAL
  Filled 2018-01-13 (×4): qty 1

## 2018-01-13 MED ORDER — ONDANSETRON HCL 4 MG/2ML IJ SOLN: 4.0000 mg | Freq: Four times a day (QID) | INTRAMUSCULAR | Status: DC | PRN

## 2018-01-13 MED ORDER — ACETAMINOPHEN 650 MG RE SUPP: 650.0000 mg | Freq: Four times a day (QID) | RECTAL | Status: DC | PRN

## 2018-01-13 MED ORDER — HYDROCHLOROTHIAZIDE 25 MG PO TABS
25.0000 mg | ORAL_TABLET | Freq: Every day | ORAL | Status: DC
  Administered 2018-01-14 – 2018-01-17 (×4): 25 mg via ORAL
  Filled 2018-01-13 (×4): qty 1

## 2018-01-13 MED ORDER — SIMVASTATIN 20 MG PO TABS
20.0000 mg | ORAL_TABLET | Freq: Every day | ORAL | Status: DC
  Administered 2018-01-13 – 2018-01-16 (×4): 20 mg via ORAL
  Filled 2018-01-13 (×4): qty 1

## 2018-01-13 MED ORDER — ASPIRIN EC 81 MG PO TBEC
81.0000 mg | DELAYED_RELEASE_TABLET | Freq: Every day | ORAL | Status: DC
  Administered 2018-01-14 – 2018-01-17 (×4): 81 mg via ORAL
  Filled 2018-01-13 (×5): qty 1

## 2018-01-13 MED ORDER — GUAIFENESIN-DM 100-10 MG/5ML PO SYRP
15.0000 mL | ORAL_SOLUTION | ORAL | Status: DC | PRN
  Administered 2018-01-13 – 2018-01-15 (×4): 15 mL via ORAL
  Filled 2018-01-13 (×5): qty 15

## 2018-01-13 MED ORDER — TRAMADOL HCL 50 MG PO TABS: 50.0000 mg | ORAL_TABLET | Freq: Four times a day (QID) | ORAL | Status: DC | PRN

## 2018-01-13 MED ORDER — GUAIFENESIN ER 600 MG PO TB12
600.0000 mg | ORAL_TABLET | Freq: Two times a day (BID) | ORAL | Status: DC
  Administered 2018-01-13 – 2018-01-17 (×9): 600 mg via ORAL
  Filled 2018-01-13 (×9): qty 1

## 2018-01-13 MED ORDER — FLUTICASONE PROPIONATE 50 MCG/ACT NA SUSP
2.0000 | Freq: Every day | NASAL | Status: DC | PRN
  Filled 2018-01-13: qty 16

## 2018-01-13 MED ORDER — IPRATROPIUM-ALBUTEROL 0.5-2.5 (3) MG/3ML IN SOLN
RESPIRATORY_TRACT | Status: AC
  Filled 2018-01-13: qty 6

## 2018-01-13 MED ORDER — AMLODIPINE BESYLATE 10 MG PO TABS
10.0000 mg | ORAL_TABLET | Freq: Every day | ORAL | Status: DC
  Administered 2018-01-14 – 2018-01-17 (×4): 10 mg via ORAL
  Filled 2018-01-13 (×4): qty 1

## 2018-01-13 MED ORDER — METHYLPREDNISOLONE SODIUM SUCC 125 MG IJ SOLR
60.0000 mg | Freq: Two times a day (BID) | INTRAMUSCULAR | Status: DC
  Administered 2018-01-13 (×2): 60 mg via INTRAVENOUS
  Filled 2018-01-13 (×2): qty 2

## 2018-01-13 MED ORDER — LORATADINE 10 MG PO TABS
10.0000 mg | ORAL_TABLET | Freq: Every day | ORAL | Status: DC
  Administered 2018-01-14 – 2018-01-17 (×4): 10 mg via ORAL
  Filled 2018-01-13 (×5): qty 1

## 2018-01-13 MED ORDER — ACETAMINOPHEN 325 MG PO TABS
650.0000 mg | ORAL_TABLET | Freq: Four times a day (QID) | ORAL | Status: DC | PRN
  Administered 2018-01-13: 650 mg via ORAL
  Filled 2018-01-13: qty 2

## 2018-01-13 MED ORDER — LISINOPRIL 20 MG PO TABS
20.0000 mg | ORAL_TABLET | Freq: Every day | ORAL | Status: DC
  Administered 2018-01-14 – 2018-01-17 (×4): 20 mg via ORAL
  Filled 2018-01-13 (×5): qty 1

## 2018-01-13 MED ORDER — IPRATROPIUM-ALBUTEROL 0.5-2.5 (3) MG/3ML IN SOLN
6.0000 mL | Freq: Once | RESPIRATORY_TRACT | Status: AC
  Administered 2018-01-13: 6 mL via RESPIRATORY_TRACT

## 2018-01-13 MED ORDER — ONDANSETRON HCL 4 MG PO TABS: 4.0000 mg | ORAL_TABLET | Freq: Four times a day (QID) | ORAL | Status: DC | PRN

## 2018-01-13 NOTE — ED Notes (Signed)
First Nurse Note: Patient in Triage 2 receiving nebulizer treatment.

## 2018-01-13 NOTE — ED Notes (Signed)
First Nurse Note: Patient states she has asthma and is visiting from out of town.  Color good, A&O, speaking in full sentences.

## 2018-01-13 NOTE — H&P (Signed)
[employer] - [place] at [facility]   PATIENT NAME: Shannon Bennett    MR#:  [id number]  DATE OF BIRTH:  22-Mar-1950  DATE OF ADMISSION:  01/13/2018  PRIMARY CARE PHYSICIAN: System, Pcp Not In   REQUESTING/REFERRING PHYSICIAN: Arnaldo Natal, MD  CHIEF COMPLAINT:   Chief Complaint  Patient presents with  . Cough  . Shortness of Breath    HISTORY OF PRESENT ILLNESS: Shannon Bennett  is a 67 y.o. female with a known history of asthma, depression, hypertension who is presenting to the hospital with cough and shortness of breath ongoing for the past few days.  Patient states that her cough started about 1 week ago and has progressively gotten worse.  Is nonproductive in nature.  She also has had fevers and chills.  Patient over the last few days has had shortness of breath and wheezing.  Patient denies any chest pain or palpitations no lower extremity swelling  PAST MEDICAL HISTORY:   Past Medical History:  Diagnosis Date  . Asthma   . Depression   . Hypertension     PAST SURGICAL HISTORY:  Past Surgical History:  Procedure Laterality Date  . ANKLE SURGERY    . appyendectomy    . ctr    . PARTIAL HYSTERECTOMY      SOCIAL HISTORY:  Social History   Tobacco Use  . Smoking status: Former Games developer  . Smokeless tobacco: Never Used  Substance Use Topics  . Alcohol use: Yes    Comment: occas    FAMILY HISTORY:  Family History  Problem Relation Age of Onset  . Hypertension Mother     DRUG ALLERGIES:  Allergies  Allergen Reactions  . Codeine   . Codeine Phosphate     REACTION: unspecified  . Meperidine Hcl   . Prednisone     REACTION: "red effect"  . Sulfamethoxazole     REACTION: rash  . Sulfonamide Derivatives     REVIEW OF SYSTEMS:   CONSTITUTIONAL: Positive fever, fatigue or positive weakness.  EYES: No blurred or double vision.  EARS, NOSE, AND THROAT: No tinnitus or ear pain.  RESPIRATORY: No cough, positive shortness of breath, positive  wheezing or hemoptysis.  CARDIOVASCULAR: No chest pain, orthopnea, edema.  GASTROINTESTINAL: No nausea, vomiting, diarrhea or abdominal pain.  GENITOURINARY: No dysuria, hematuria.  ENDOCRINE: No polyuria, nocturia,  HEMATOLOGY: No anemia, easy bruising or bleeding SKIN: No rash or lesion. MUSCULOSKELETAL: No joint pain or arthritis.   NEUROLOGIC: No tingling, numbness, weakness.  PSYCHIATRY: No anxiety or depression.   MEDICATIONS AT HOME:  Prior to Admission medications   Medication Sig Start Date End Date Taking? Authorizing Provider  albuterol (VENTOLIN HFA) 108 (90 BASE) MCG/ACT inhaler Inhale 2 puffs into the lungs every 4 (four) hours as needed.     Yes [provider]  amLODipine (NORVASC) 10 MG tablet Take 10 mg by mouth daily.   Yes [provider]  aspirin EC 81 MG EC tablet Take 81 mg by mouth daily.     Yes [provider]  cetirizine (ZYRTEC ALLERGY) 10 MG tablet Take 10 mg by mouth at bedtime.     Yes [provider]  hydrochlorothiazide (HYDRODIURIL) 25 MG tablet Take 25 mg by mouth daily.   Yes [provider]  metoprolol (LOPRESSOR) 50 MG tablet Take 1 tablet (50 mg total) by mouth 2 (two) times daily. 05/20/11  Yes Caviness, Miachel Roux, MD  pantoprazole (PROTONIX) 40 MG tablet Take 40 mg by  mouth daily.   Yes [provider]  PARoxetine (PAXIL) 20 MG tablet Take 20 mg by mouth daily.     Yes [provider]  simvastatin (ZOCOR) 20 MG tablet Take 20 mg by mouth at bedtime.     Yes [provider]  vitamin E 100 UNIT capsule Take 100 Units by mouth daily.   Yes [provider]  fluticasone (FLONASE) 50 MCG/ACT nasal spray 2 sprays by Nasal route daily.      [provider]  Fluticasone-Salmeterol (ADVAIR DISKUS) 250-50 MCG/DOSE AEPB Inhale 1 puff into the lungs 2 (two) times daily.      [provider]  lisinopril (PRINIVIL,ZESTRIL) 20 MG tablet Take 20 mg by mouth daily.     [provider]  losartan-hydrochlorothiazide (HYZAAR) 100-25 MG per tablet Take 1 tablet by mouth daily.      [provider]  montelukast (SINGULAIR) 10 MG tablet Take 10 mg by mouth at bedtime.      [provider]  omeprazole (PRILOSEC OTC) 20 MG tablet Take 20 mg by mouth at bedtime.      [provider]      PHYSICAL EXAMINATION:   VITAL SIGNS: Blood pressure (!) 158/75, pulse 81, temperature 97.7 F (36.5 C), temperature source Oral, resp. rate 19, height 4\' 11"  (1.499 m), weight 93 kg, SpO2 92 %.  GENERAL:  67 y.o.-year-old patient lying in the bed with no acute distress.  EYES: Pupils equal, round, reactive to light and accommodation. No scleral icterus. Extraocular muscles intact.  HEENT: Head atraumatic, normocephalic. Oropharynx and nasopharynx clear.  NECK:  Supple, no jugular venous distention. No thyroid enlargement, no tenderness.  LUNGS: Bilateral wheezing throughout both lungs no accessory muscle usage CARDIOVASCULAR: S1, S2 normal. No murmurs, rubs, or gallops.  ABDOMEN: Soft, nontender, nondistended. Bowel sounds present. No organomegaly or mass.  EXTREMITIES: No pedal edema, cyanosis, or clubbing.  NEUROLOGIC: Cranial nerves II through XII are intact. Muscle strength 5/5 in all extremities. Sensation intact. Gait not checked.  PSYCHIATRIC: The patient is alert and oriented x 3.  SKIN: No obvious rash, lesion, or ulcer.   LABORATORY PANEL:   CBC Recent Labs  Lab 01/13/18 0936  WBC 6.3  HGB 15.6*  HCT 46.2*  PLT 220  MCV 84.9  MCH 28.7  MCHC 33.8  RDW 12.5   ------------------------------------------------------------------------------------------------------------------  Chemistries  Recent Labs  Lab 01/13/18 0936  NA 141  K 3.2*  CL 106  CO2 25  GLUCOSE 142*  BUN 10  CREATININE 0.61  CALCIUM 9.3  AST 24  ALT 19  ALKPHOS 83  BILITOT 0.9    ------------------------------------------------------------------------------------------------------------------ estimated creatinine clearance is 69.8 mL/min (by C-G formula based on SCr of 0.61 mg/dL). ------------------------------------------------------------------------------------------------------------------ No results for input(s): TSH, T4TOTAL, T3FREE, THYROIDAB in the last 72 hours.  Invalid input(s): FREET3   Coagulation profile No results for input(s): INR, PROTIME in the last 168 hours. ------------------------------------------------------------------------------------------------------------------- No results for input(s): DDIMER in the last 72 hours. -------------------------------------------------------------------------------------------------------------------  Cardiac Enzymes No results for input(s): CKMB, TROPONINI, MYOGLOBIN in the last 168 hours.  Invalid input(s): CK ------------------------------------------------------------------------------------------------------------------ Invalid input(s): POCBNP  ---------------------------------------------------------------------------------------------------------------  Urinalysis No results found for: COLORURINE, APPEARANCEUR, LABSPEC, PHURINE, GLUCOSEU, HGBUR, BILIRUBINUR, KETONESUR, PROTEINUR, UROBILINOGEN, NITRITE, LEUKOCYTESUR   RADIOLOGY: Dg Chest 2 View  Result Date: 01/13/2018 CLINICAL DATA:  Cough and shortness of breath. EXAM: CHEST - 2 VIEW COMPARISON:  12/22/2008. FINDINGS: Mediastinum hilar structures normal. Low lung volumes with mild bibasilar atelectasis. No pleural effusion pneumothorax.  Heart size normal. Degenerative change thoracic spine. IMPRESSION: Low lung volumes with mild bibasilar atelectasis. Electronically Signed   By: Maisie Fushomas  Register   On: 01/13/2018 10:18    EKG: Orders placed or performed during the hospital encounter of 01/13/18  . ED EKG  . ED EKG    IMPRESSION AND  PLAN: Patient is 67 year old with history of asthma presenting with shortness of breath cough  1.  Acute asthma exacerbation we will treat with nebulizers, steroids and mucolytic's  2.  Influenza A treated with Tamiflu supportive care  3.  Hypertension we will continue antihypertensives with Norvasc, hydrochlorothiazide and Lopressor  4.  Hyperlipidemia continue Zocor  5.  GERD continue PPI  6.  Miscellaneous Lovenox for DVT prophylaxis    All the records are reviewed and case discussed with ED provider. Management plans discussed with the patient, family and they are in agreement.  CODE STATUS:    TOTAL TIME TAKING CARE OF THIS PATIENT: 55 minutes.    Auburn BilberryShreyang Latavion Halls M.D on 01/13/2018 at 12:57 PM  Between 7am to 6pm - Pager - [phone]  After 6pm go to [email or web address] - password [email or web address] [facility]  [employer] Office  [phone]  CC: Primary care physician; System, Pcp Not In

## 2018-01-13 NOTE — ED Notes (Signed)
Attempted to call report, per Tiffany RN she will call right back

## 2018-01-13 NOTE — ED Provider Notes (Signed)
[facility] Emergency Department Provider Note   ____________________________________________   First MD Initiated Contact with Patient 01/13/18 1052     (approximate)  I have reviewed the triage vital signs and the nursing notes.   HISTORY  Chief Complaint Cough and Shortness of Breath   HPI Shannon Bennett is a 67 y.o. female who is visiting here back from [place].  She has been exposed to the flu.  She has had a cough and increasing shortness of breath for the last few days.  Just cough is nonproductive but very wet sounding.  She gets short of breath and oxygen levels fall to 88% with ambulation.  Past Medical History:  Diagnosis Date  . Asthma   . Depression   . Hypertension     Patient Active Problem List   Diagnosis Date Noted  . CARPAL TUNNEL SYNDROME, RIGHT 08/23/2009  . ALLERGIC RHINITIS 04/28/2007  . HYPERCHOLESTEROLEMIA 03/14/2006  . OBESITY, NOS 03/14/2006  . DEPRESSION, MAJOR, RECURRENT 03/14/2006  . ANXIETY 03/14/2006  . HYPERTENSION, BENIGN SYSTEMIC 03/14/2006  . ASTHMA, PERSISTENT 03/14/2006  . GASTROESOPHAGEAL REFLUX, NO ESOPHAGITIS 03/14/2006  . 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 ARTHRITIS 03/14/2006  . INSOMNIA NOS 03/14/2006    History reviewed. No pertinent surgical history.  Prior to Admission medications   Medication Sig Start Date End Date Taking? Authorizing Provider  albuterol (VENTOLIN HFA) 108 (90 BASE) MCG/ACT inhaler Inhale 2 puffs into the lungs every 4 (four) hours as needed.     Yes [provider]  amLODipine (NORVASC) 10 MG tablet Take 10 mg by mouth daily.   Yes [provider]  aspirin EC 81 MG EC tablet Take 81 mg by mouth daily.     Yes [provider]  cetirizine (ZYRTEC ALLERGY) 10 MG tablet Take 10 mg by mouth at bedtime.     Yes [provider]  hydrochlorothiazide (HYDRODIURIL) 25 MG tablet Take 25 mg by mouth daily.   Yes [provider]    metoprolol (LOPRESSOR) 50 MG tablet Take 1 tablet (50 mg total) by mouth 2 (two) times daily. 05/20/11  Yes Caviness, Miachel Roux, MD  pantoprazole (PROTONIX) 40 MG tablet Take 40 mg by mouth daily.   Yes [provider]  PARoxetine (PAXIL) 20 MG tablet Take 20 mg by mouth daily.     Yes [provider]  simvastatin (ZOCOR) 20 MG tablet Take 20 mg by mouth at bedtime.     Yes [provider]  vitamin E 100 UNIT capsule Take 100 Units by mouth daily.   Yes [provider]  fluticasone (FLONASE) 50 MCG/ACT nasal spray 2 sprays by Nasal route daily.      [provider]  Fluticasone-Salmeterol (ADVAIR DISKUS) 250-50 MCG/DOSE AEPB Inhale 1 puff into the lungs 2 (two) times daily.      [provider]  lisinopril (PRINIVIL,ZESTRIL) 20 MG tablet Take 20 mg by mouth daily.    [provider]  losartan-hydrochlorothiazide (HYZAAR) 100-25 MG per tablet Take 1 tablet by mouth daily.      [provider]  montelukast (SINGULAIR) 10 MG tablet Take 10 mg by mouth at bedtime.      [provider]  omeprazole (PRILOSEC OTC) 20 MG tablet Take 20 mg by mouth at bedtime.      [provider]    Allergies Codeine; Codeine phosphate; Meperidine hcl; Prednisone; Sulfamethoxazole; and Sulfonamide derivatives  No family history on file.  Social History Social History  Tobacco Use  . Smoking status: Former Games developermoker  . Smokeless tobacco: Never Used  Substance Use Topics  . Alcohol use: Yes    Comment: occas  . Drug use: Not on file    Review of Systems  Constitutional: No fever/chills Eyes: No visual changes. ENT: No sore throat. Cardiovascular: Denies chest pain. Respiratory:  shortness of breath. Gastrointestinal: No abdominal pain.  No nausea, no vomiting.  No diarrhea.  No constipation. Genitourinary: Negative for dysuria. Musculoskeletal: Negative for back pain. Skin: Negative for rash. Neurological: Negative for  headaches, focal weakness  ____________________________________________   PHYSICAL EXAM:  VITAL SIGNS: ED Triage Vitals  Enc Vitals Group     BP 01/13/18 0937 (!) 152/85     Pulse Rate 01/13/18 0937 87     Resp 01/13/18 0937 (!) 22     Temp 01/13/18 0937 97.7 F (36.5 C)     Temp Source 01/13/18 0937 Oral     SpO2 01/13/18 0937 95 %     Weight 01/13/18 0930 205 lb (93 kg)     Height 01/13/18 0930 4\' 11"  (1.499 m)     Head Circumference --      Peak Flow --      Pain Score 01/13/18 0930 0     Pain Loc --      Pain Edu? --      Excl. in GC? --     Constitutional: Alert and oriented.  Looks uncomfortable and short of breath Eyes: Conjunctivae are normal.  Head: Atraumatic. Nose: No congestion/rhinnorhea. Mouth/Throat: Mucous membranes are moist.  Oropharynx non-erythematous. Neck: No stridor.   Cardiovascular: Normal rate, regular rhythm. Grossly normal heart sounds.  Good peripheral circulation. Respiratory: Normal respiratory effort.  No retractions. Lungs diffuse wheezes and crackles improved somewhat after DuoNeb's Gastrointestinal: Soft and nontender. No distention. No abdominal bruits. No CVA tenderness. Musculoskeletal: No lower extremity tenderness nor edema.  Neurologic:  Normal speech and language. No gross focal neurologic deficits are appreciated.  Skin:  Skin is warm, dry and intact. No rash noted. Psychiatric: Mood and affect are normal. Speech and behavior are normal.  ____________________________________________   LABS (all labs ordered are listed, but only abnormal results are displayed)  Labs Reviewed  CBC - Abnormal; Notable for the following components:      Result Value   RBC 5.44 (*)    Hemoglobin 15.6 (*)    HCT 46.2 (*)    All other components within normal limits  COMPREHENSIVE METABOLIC PANEL - Abnormal; Notable for the following components:   Potassium 3.2 (*)    Glucose, Bld 142 (*)    All other components within normal limits    INFLUENZA PANEL BY PCR (TYPE A & B) - Abnormal; Notable for the following components:   Influenza A By PCR POSITIVE (*)    All other components within normal limits   ____________________________________________  EKG   ____________________________________________  RADIOLOGY  ED MD interpretation: Chest x-ray read by radiology as only mild bibasilar atelectasis I did review the film.  Official radiology report(s): Dg Chest 2 View  Result Date: 01/13/2018 CLINICAL DATA:  Cough and shortness of breath. EXAM: CHEST - 2 VIEW COMPARISON:  12/22/2008. FINDINGS: Mediastinum hilar structures normal. Low lung volumes with mild bibasilar atelectasis. No pleural effusion pneumothorax. Heart size normal. Degenerative change thoracic spine. IMPRESSION: Low lung volumes with mild bibasilar atelectasis. Electronically Signed   By: Maisie Fushomas  Register   On: 01/13/2018 10:18    ____________________________________________   PROCEDURES  Procedure(s) performed:   Procedures  Critical Care performed:   ____________________________________________   INITIAL IMPRESSION / ASSESSMENT AND PLAN / ED COURSE  Patient with a history of asthma is coughing and wheezing hypoxic after ambulation going down to 88%.  This is after she has had 2 duo nebs.  She is positive for the flu.  We will get her started on Tamiflu I would not use steroids at this point although I will defer to the hospitalist recommendations.  We will get her in the hospital for the time being until he Tamiflu can take effect and the hypoxia is resolving.       ____________________________________________   FINAL CLINICAL IMPRESSION(S) / ED DIAGNOSES  Final diagnoses:  Influenza  Hypoxia  Wheezing     ED Discharge Orders    None       Note:  This document was prepared using Dragon voice recognition software and may include unintentional dictation errors.    Malinda, PauArnaldo Natall F, MD 01/13/18 [phone]

## 2018-01-13 NOTE — ED Notes (Signed)
Admitting MD at bedside.

## 2018-01-13 NOTE — ED Notes (Signed)
First Nurse Note: Patient to Rm 1, Jane RN aware of room placement.

## 2018-01-13 NOTE — ED Notes (Signed)
Pt ambulated and states shortness of breath more when ambulating. O2 dropped to 88% RA. Pt repostioned back in bed. Pt resting comfortably at this time. MD Malinda aware

## 2018-01-13 NOTE — ED Triage Notes (Signed)
Pt here with c/o cough and shob for the past few weeks now, states she was exposed to the flu. Nonproductive cough with audible wheezing noted, has been taking mucinex dm with no relief. Appears in NAD at this time.

## 2018-01-13 NOTE — ED Notes (Signed)
Breathing tx triage 2.

## 2018-01-13 NOTE — Progress Notes (Signed)
Advanced care plan.  Purpose of the Encounter: CODE STATUS  Parties in Attendance: Patient herself  Patient's Decision Capacity: Intact  Subjective/Patient's story: Shannon Bennett  is a 67 y.o. female with a known history of asthma, depression, hypertension who is presenting to the hospital with cough and shortness of breath ongoing for the past few days.  Patient states that her cough started about 1 week ago patient noted to have asthma exacerbation and influenza A   Objective/Medical story I discussed with the patient regarding his her desires for cardiac and pulmonary resuscitation and further goals of care in the future   Goals of care determination:   Patient is hesitant to make the decision.  She states that she would not like to be on a prolonged machines.  She would like immediate resuscitation.  I gave her recommendation that she needs to get a living will to specify her wishes so her family knows.  CODE STATUS:  Full code  Time spent discussing advanced care planning: 16 minutes

## 2018-01-14 LAB — BASIC METABOLIC PANEL
Anion gap: 9 (ref 5–15)
BUN: 8 mg/dL (ref 8–23)
CO2: 24 mmol/L (ref 22–32)
Calcium: 8.5 mg/dL — ABNORMAL LOW (ref 8.9–10.3)
Chloride: 107 mmol/L (ref 98–111)
Creatinine, Ser: 0.43 mg/dL — ABNORMAL LOW (ref 0.44–1.00)
GFR calc Af Amer: >60 mL/min (ref >60)
GFR calc non Af Amer: >60 mL/min (ref >60)
Glucose, Bld: 160 mg/dL — ABNORMAL HIGH (ref 70–99)
Potassium: 3.5 mmol/L (ref 3.5–5.1)
Sodium: 140 mmol/L (ref 135–145)

## 2018-01-14 MED ORDER — ACETAMINOPHEN 500 MG PO TABS
500.0000 mg | ORAL_TABLET | Freq: Four times a day (QID) | ORAL | Status: DC | PRN
  Administered 2018-01-14 – 2018-01-15 (×2): 500 mg via ORAL
  Filled 2018-01-14 (×2): qty 1

## 2018-01-14 MED ORDER — METHYLPREDNISOLONE SODIUM SUCC 40 MG IJ SOLR
40.0000 mg | Freq: Two times a day (BID) | INTRAMUSCULAR | Status: DC
  Administered 2018-01-14 – 2018-01-17 (×7): 40 mg via INTRAVENOUS
  Filled 2018-01-14 (×6): qty 1

## 2018-01-14 MED ORDER — IPRATROPIUM-ALBUTEROL 0.5-2.5 (3) MG/3ML IN SOLN
3.0000 mL | Freq: Four times a day (QID) | RESPIRATORY_TRACT | Status: DC
  Administered 2018-01-14 – 2018-01-15 (×4): 3 mL via RESPIRATORY_TRACT
  Filled 2018-01-14 (×4): qty 3

## 2018-01-14 NOTE — Care Management (Signed)
Met with patient, MOON letter provided.  Patient has no needs at this time.  Travelling here and staying/visitng with family and daughter for the next 3 months, advised her to go to Urgent care if needed as PCP is in [place], Not requiring supplemental O2 at this time

## 2018-01-14 NOTE — Progress Notes (Signed)
[employer] - [place] at [facility]   PATIENT NAME: Shannon Bennett    MR#:  [id number]  DATE OF BIRTH:  Nov 22, 1950  SUBJECTIVE:   SOB this am improved a bit from admission  REVIEW OF SYSTEMS:    Review of Systems  Constitutional: Negative for fever, chills weight loss HENT: Negative for ear pain, nosebleeds, congestion, facial swelling, rhinorrhea, neck pain, neck stiffness and ear discharge.   Respiratory:++ for cough, shortness of breath, wheezing  Cardiovascular: Negative for chest pain, palpitations and leg swelling.  Gastrointestinal: Negative for heartburn, abdominal pain, vomiting, diarrhea or consitpation Genitourinary: Negative for dysuria, urgency, frequency, hematuria Musculoskeletal: Negative for back pain or joint pain Neurological: Negative for dizziness, seizures, syncope, focal weakness,  numbness and headaches.  Hematological: Does not bruise/bleed easily.  Psychiatric/Behavioral: Negative for hallucinations, confusion, dysphoric mood    Tolerating Diet:yes      DRUG ALLERGIES:   Allergies  Allergen Reactions  . Codeine   . Codeine Phosphate     REACTION: unspecified  . Meperidine Hcl   . Prednisone     REACTION: "red effect"  . Sulfamethoxazole     REACTION: rash  . Sulfonamide Derivatives     VITALS:  Blood pressure (!) 154/72, pulse 66, temperature 97.6 F (36.4 C), temperature source Oral, resp. rate 20, height 4\' 11"  (1.499 m), weight 93 kg, SpO2 97 %.  PHYSICAL EXAMINATION:  Constitutional: Appears obese no distress. HENT: Normocephalic. Marland Kitchen. Oropharynx is clear and moist.  Eyes: Conjunctivae and EOM are normal. PERRLA, no scleral icterus.  Neck: Normal ROM. Neck supple. No JVD. No tracheal deviation. CVS: RRR, S1/S2 +, no murmurs, no gallops, no carotid bruit.  Pulmonary: Normal respiratory effort with bilateral wheezing/rhonchi Abdominal: Soft. BS +,  no distension, tenderness, rebound or guarding.  Musculoskeletal:  Normal range of motion. No edema and no tenderness.  Neuro: Alert. CN 2-12 grossly intact. No focal deficits. Skin: Skin is warm and dry. No rash noted. Psychiatric: Normal mood and affect.      LABORATORY PANEL:   CBC Recent Labs  Lab 01/13/18 0936  WBC 6.3  HGB 15.6*  HCT 46.2*  PLT 220   ------------------------------------------------------------------------------------------------------------------  Chemistries  Recent Labs  Lab 01/13/18 0936 01/14/18 0825  NA 141 140  K 3.2* 3.5  CL 106 107  CO2 25 24  GLUCOSE 142* 160*  BUN 10 8  CREATININE 0.61 0.43*  CALCIUM 9.3 8.5*  AST 24  --   ALT 19  --   ALKPHOS 83  --   BILITOT 0.9  --    ------------------------------------------------------------------------------------------------------------------  Cardiac Enzymes No results for input(s): TROPONINI in the last 168 hours. ------------------------------------------------------------------------------------------------------------------  RADIOLOGY:  Dg Chest 2 View  Result Date: 01/13/2018 CLINICAL DATA:  Cough and shortness of breath. EXAM: CHEST - 2 VIEW COMPARISON:  12/22/2008. FINDINGS: Mediastinum hilar structures normal. Low lung volumes with mild bibasilar atelectasis. No pleural effusion pneumothorax. Heart size normal. Degenerative change thoracic spine. IMPRESSION: Low lung volumes with mild bibasilar atelectasis. Electronically Signed   By: Maisie Fushomas  Register   On: 01/13/2018 10:18     ASSESSMENT AND PLAN:   67 year old female with a history of asthma and essential hypertension who presented to the emergency room due to shortness of breath and cough.   1.  Acute mild intermittent asthma exacerbation triggered by influenza A: Wean IV steroids Continue nebs/inhalers She will need an inhaler such as Advair prior to discharge.  2.  Influenza A: Continue Tamiflu for total 5  days  3.  Essential hypertension: Continue rest, metoprolol, lisinopril and  HCTZ 4.  Morbid obesity: Encouraged weight loss as tolerated  Management plans discussed with the patient and she is in agreement.  CODE STATUS: full  TOTAL TIME TAKING CARE OF THIS PATIENT: 30 minutes.     POSSIBLE D/C 1-2 days, DEPENDING ON CLINICAL CONDITION.   Broughton Eppinger M.D on 01/14/2018 at 10:22 AM  Between 7am to 6pm - Pager - [phone] After 6pm go to [email or web address] - password [email or web address]  [facility] [place] [facility]  Office  [phone]  CC: Primary care physician; System, Pcp Not In  Note: This dictation was prepared with Dragon dictation along with smaller phrase technology. Any transcriptional errors that result from this process are unintentional.

## 2018-01-14 NOTE — Care Management Obs Status (Signed)
MEDICARE OBSERVATION STATUS NOTIFICATION   Patient Details  Name: Donney DiceVanessa L Schara MRN: [id number] Date of Birth: 1950-11-05   Medicare Observation Status Notification Given:  Yes    Collie Siadngela Suprena Travaglini, RN 01/14/2018, 10:46 AM

## 2018-01-15 DIAGNOSIS — Z7951 Long term (current) use of inhaled steroids: Secondary | ICD-10-CM | POA: Diagnosis not present

## 2018-01-15 DIAGNOSIS — Z885 Allergy status to narcotic agent status: Secondary | ICD-10-CM | POA: Diagnosis not present

## 2018-01-15 DIAGNOSIS — Z87891 Personal history of nicotine dependence: Secondary | ICD-10-CM | POA: Diagnosis not present

## 2018-01-15 DIAGNOSIS — Z882 Allergy status to sulfonamides status: Secondary | ICD-10-CM | POA: Diagnosis not present

## 2018-01-15 DIAGNOSIS — Z7982 Long term (current) use of aspirin: Secondary | ICD-10-CM | POA: Diagnosis not present

## 2018-01-15 DIAGNOSIS — J45901 Unspecified asthma with (acute) exacerbation: Secondary | ICD-10-CM | POA: Diagnosis present

## 2018-01-15 DIAGNOSIS — J101 Influenza due to other identified influenza virus with other respiratory manifestations: Secondary | ICD-10-CM | POA: Diagnosis present

## 2018-01-15 DIAGNOSIS — I10 Essential (primary) hypertension: Secondary | ICD-10-CM | POA: Diagnosis present

## 2018-01-15 DIAGNOSIS — E785 Hyperlipidemia, unspecified: Secondary | ICD-10-CM | POA: Diagnosis present

## 2018-01-15 DIAGNOSIS — J209 Acute bronchitis, unspecified: Secondary | ICD-10-CM | POA: Diagnosis present

## 2018-01-15 DIAGNOSIS — Z888 Allergy status to other drugs, medicaments and biological substances status: Secondary | ICD-10-CM | POA: Diagnosis not present

## 2018-01-15 DIAGNOSIS — F329 Major depressive disorder, single episode, unspecified: Secondary | ICD-10-CM | POA: Diagnosis present

## 2018-01-15 DIAGNOSIS — Z79899 Other long term (current) drug therapy: Secondary | ICD-10-CM | POA: Diagnosis not present

## 2018-01-15 DIAGNOSIS — J4521 Mild intermittent asthma with (acute) exacerbation: Secondary | ICD-10-CM | POA: Diagnosis present

## 2018-01-15 MED ORDER — ALBUTEROL SULFATE (2.5 MG/3ML) 0.083% IN NEBU: 2.5000 mg | INHALATION_SOLUTION | RESPIRATORY_TRACT | Status: DC | PRN

## 2018-01-15 MED ORDER — IPRATROPIUM-ALBUTEROL 0.5-2.5 (3) MG/3ML IN SOLN
3.0000 mL | Freq: Three times a day (TID) | RESPIRATORY_TRACT | Status: DC
  Administered 2018-01-15 – 2018-01-17 (×7): 3 mL via RESPIRATORY_TRACT
  Filled 2018-01-15 (×7): qty 3

## 2018-01-15 NOTE — Progress Notes (Signed)
   01/15/18 0740  Clinical Encounter Type  Visited With Patient  Visit Type Initial (order for AD)  Referral From Physician   Chaplain responded to order regarding AD.  Patient indicated that she was not interested in an AD at this point in time.

## 2018-01-15 NOTE — Progress Notes (Signed)
[facility] - [place] at [facility]   PATIENT NAME: Shannon Bennett    MR#:  [id number]  DATE OF BIRTH:  December 14, 1950  SUBJECTIVE:   SOB gradually improving.  Patient still have weight shortness of breath and audible wheezing.  Still has some cough.  No fevers.  Not feeling well enough to be discharged home today.  REVIEW OF SYSTEMS:    Review of Systems  Constitutional: Negative for fever, chills weight loss HENT: Negative for ear pain, nosebleeds, congestion, facial swelling, rhinorrhea, neck pain, neck stiffness and ear discharge.   Respiratory:++ for cough, positive shortness of breath, positive wheezing  Cardiovascular: Negative for chest pain, palpitations and leg swelling.  Gastrointestinal: Negative for heartburn, abdominal pain, vomiting, diarrhea or consitpation Genitourinary: Negative for dysuria, urgency, frequency, hematuria Musculoskeletal: Negative for back pain or joint pain Neurological: Negative for dizziness, seizures, syncope, focal weakness,   Hematological: Does not bruise/bleed easily.  Psychiatric/Behavioral: Negative for hallucinations, confusion, dysphoric mood    Tolerating Diet:yes      DRUG ALLERGIES:   Allergies  Allergen Reactions  . Codeine   . Codeine Phosphate     REACTION: unspecified  . Meperidine Hcl   . Prednisone     REACTION: "red effect"  . Sulfamethoxazole     REACTION: rash  . Sulfonamide Derivatives     VITALS:  Blood pressure (!) 149/55, pulse 69, temperature 98 F (36.7 C), temperature source Oral, resp. rate 18, height 4\' 11"  (1.499 m), weight 93 kg, SpO2 97 %.  PHYSICAL EXAMINATION:  Constitutional: Appears obese no distress. HENT: Normocephalic. Marland Kitchen Oropharynx is clear and moist.  Eyes: Conjunctivae and EOM are normal. PERRLA, no scleral icterus.  Neck: Normal ROM. Neck supple. No JVD. No tracheal deviation. CVS: RRR, S1/S2 +, no murmurs, no gallops, no carotid bruit.  Pulmonary: bilateral  wheezing/rhonchi on exam. Abdominal: Soft. BS +,  no distension, tenderness, rebound or guarding.  Musculoskeletal: Normal range of motion. No edema and no tenderness.  Neuro: Alert. CN 2-12 grossly intact. No focal deficits. Skin: Skin is warm and dry. No rash noted. Psychiatric: Normal mood and affect.      LABORATORY PANEL:   CBC Recent Labs  Lab 01/13/18 0936  WBC 6.3  HGB 15.6*  HCT 46.2*  PLT 220   ------------------------------------------------------------------------------------------------------------------  Chemistries  Recent Labs  Lab 01/13/18 0936 01/14/18 0825  NA 141 140  K 3.2* 3.5  CL 106 107  CO2 25 24  GLUCOSE 142* 160*  BUN 10 8  CREATININE 0.61 0.43*  CALCIUM 9.3 8.5*  AST 24  --   ALT 19  --   ALKPHOS 83  --   BILITOT 0.9  --    ------------------------------------------------------------------------------------------------------------------  Cardiac Enzymes No results for input(s): TROPONINI in the last 168 hours. ------------------------------------------------------------------------------------------------------------------  RADIOLOGY:  No results found.   ASSESSMENT AND PLAN:   68 year old female with a history of asthma and essential hypertension who presented to the emergency room due to shortness of breath and cough.   1.  Acute moderate intermittent asthma exacerbation triggered by influenza A: Patient gradually improving but still has significant wheezing and rales on exam. Continue IV Solu-Medrol 40 mg every 12 hourly for at least next 24 hours.  Not clinically stable enough for discharge home today. Continue nebs/inhalers She will need an inhaler such as Advair prior to discharge. Patient also will benefit from nebulizer treatments and nebulizer machine on discharge  2.  Influenza A: Continue Tamiflu for total 5 days  3.  Essential hypertension: Continue rest, metoprolol, lisinopril and HCTZ  4.  Morbid obesity:  Encouraged weight loss as tolerated  Management plans discussed with the patient and she is in agreement.  CODE STATUS: full  TOTAL TIME TAKING CARE OF THIS PATIENT: 32 minutes.   DVT prophylaxis; Lovenox  POSSIBLE D/C am, DEPENDING ON CLINICAL CONDITION.   [name] M.D on 01/15/2018 at 12:47 PM  Between 7am to 6pm - Pager - [phone] After 6pm go to [email or web address] - password [email or web address]  [facility]  Office  [phone]  CC: Primary care physician; System, Pcp Not In  Note: This dictation was prepared with Dragon dictation along with smaller phrase technology. Any transcriptional errors that result from this process are unintentional.

## 2018-01-15 NOTE — Plan of Care (Signed)

## 2018-01-16 ENCOUNTER — Inpatient Hospital Stay: Payer: Medicare Other

## 2018-01-16 LAB — BASIC METABOLIC PANEL
Anion gap: 11 (ref 5–15)
BUN: 13 mg/dL (ref 8–23)
CO2: 29 mmol/L (ref 22–32)
Calcium: 9 mg/dL (ref 8.9–10.3)
Chloride: 100 mmol/L (ref 98–111)
Creatinine, Ser: 0.58 mg/dL (ref 0.44–1.00)
GFR calc Af Amer: >60 mL/min (ref >60)
GFR calc non Af Amer: >60 mL/min (ref >60)
Glucose, Bld: 185 mg/dL — ABNORMAL HIGH (ref 70–99)
Potassium: 2.9 mmol/L — ABNORMAL LOW (ref 3.5–5.1)
Sodium: 140 mmol/L (ref 135–145)

## 2018-01-16 LAB — MAGNESIUM: Magnesium: 1.9 mg/dL (ref 1.7–2.4)

## 2018-01-16 MED ORDER — POTASSIUM CHLORIDE 10 MEQ/100ML IV SOLN
10.0000 meq | INTRAVENOUS | Status: AC
  Administered 2018-01-16 (×2): 10 meq via INTRAVENOUS
  Filled 2018-01-16 (×2): qty 100

## 2018-01-16 MED ORDER — POTASSIUM CHLORIDE CRYS ER 20 MEQ PO TBCR
40.0000 meq | EXTENDED_RELEASE_TABLET | Freq: Two times a day (BID) | ORAL | Status: AC
  Administered 2018-01-16: 40 meq via ORAL
  Filled 2018-01-16: qty 2

## 2018-01-16 MED ORDER — LEVOFLOXACIN IN D5W 750 MG/150ML IV SOLN
750.0000 mg | INTRAVENOUS | Status: DC
  Administered 2018-01-16 – 2018-01-17 (×2): 750 mg via INTRAVENOUS
  Filled 2018-01-16 (×3): qty 150

## 2018-01-16 MED ORDER — POTASSIUM CHLORIDE CRYS ER 20 MEQ PO TBCR
40.0000 meq | EXTENDED_RELEASE_TABLET | Freq: Once | ORAL | Status: AC
  Administered 2018-01-16: 40 meq via ORAL
  Filled 2018-01-16: qty 2

## 2018-01-16 NOTE — Progress Notes (Signed)
Patient K+ 2.9 this morning. MD notified. Orders pending.

## 2018-01-16 NOTE — Progress Notes (Signed)
[facility] - [place] at [facility]   PATIENT NAME: Shannon Bennett    MR#:  [id number]  DATE OF BIRTH:  1950-08-24  SUBJECTIVE:   SOB gradually improving.  Patient still have weight shortness of breath, coughing and audible wheezing.    No fevers.  Not feeling well enough to be discharged home today.  REVIEW OF SYSTEMS:    Review of Systems  Constitutional: Negative for fever, chills weight loss HENT: Negative for ear pain, nosebleeds, congestion, facial swelling, rhinorrhea, neck pain, neck stiffness and ear discharge.   Respiratory:+ for cough, positive shortness of breath, positive wheezing  Cardiovascular: Negative for chest pain, palpitations and leg swelling.  Gastrointestinal: Negative for heartburn, abdominal pain, vomiting, diarrhea or consitpation Genitourinary: Negative for dysuria, urgency, frequency, hematuria Musculoskeletal: Negative for back pain or joint pain Neurological: Negative for dizziness, seizures, syncope, focal weakness,   Hematological: Does not bruise/bleed easily.  Psychiatric/Behavioral: Negative for hallucinations, confusion, dysphoric mood   Tolerating Diet:yes      DRUG ALLERGIES:   Allergies  Allergen Reactions  . Codeine   . Codeine Phosphate     REACTION: unspecified  . Meperidine Hcl   . Prednisone     REACTION: "red effect"  . Sulfamethoxazole     REACTION: rash  . Sulfonamide Derivatives     VITALS:  Blood pressure (!) 145/60, pulse 70, temperature 97.8 F (36.6 C), temperature source Oral, resp. rate 17, height 4\' 11"  (1.499 m), weight 93 kg, SpO2 94 %.  PHYSICAL EXAMINATION:  Constitutional: Appears obese no distress. HENT: Normocephalic. Marland Kitchen Oropharynx is clear and moist.  Eyes: Conjunctivae and EOM are normal. PERRLA, no scleral icterus.  Neck: Normal ROM. Neck supple. No JVD. No tracheal deviation. CVS: RRR, S1/S2 +, no murmurs, no gallops, no carotid bruit.  Pulmonary: bilateral wheezing/rhonchi on  exam. Abdominal: Soft. BS +,  no distension, tenderness, rebound or guarding.  Musculoskeletal: Normal range of motion. No edema and no tenderness.  Neuro: Alert. CN 2-12 grossly intact. No focal deficits. Skin: Skin is warm and dry. No rash noted. Psychiatric: Normal mood and affect.      LABORATORY PANEL:   CBC Recent Labs  Lab 01/13/18 0936  WBC 6.3  HGB 15.6*  HCT 46.2*  PLT 220   ------------------------------------------------------------------------------------------------------------------  Chemistries  Recent Labs  Lab 01/13/18 0936  01/16/18 0334  NA 141   < > 140  K 3.2*   < > 2.9*  CL 106   < > 100  CO2 25   < > 29  GLUCOSE 142*   < > 185*  BUN 10   < > 13  CREATININE 0.61   < > 0.58  CALCIUM 9.3   < > 9.0  MG  --   --  1.9  AST 24  --   --   ALT 19  --   --   ALKPHOS 83  --   --   BILITOT 0.9  --   --    < > = values in this interval not displayed.   ------------------------------------------------------------------------------------------------------------------  Cardiac Enzymes No results for input(s): TROPONINI in the last 168 hours. ------------------------------------------------------------------------------------------------------------------  RADIOLOGY:  Dg Chest 2 View  Result Date: 01/16/2018 CLINICAL DATA:  Shortness of breath and cough EXAM: CHEST - 2 VIEW COMPARISON:  January 13, 2018 FINDINGS: There is no edema or consolidation. The heart size and pulmonary vascularity are normal. No adenopathy. There is degenerative change in the thoracic spine. There is calcification in the  right carotid artery. IMPRESSION: No edema or consolidation. There is right carotid artery calcification. Electronically Signed   By: Bretta BangWilliam  Woodruff III M.D.   On: 01/16/2018 10:37     ASSESSMENT AND PLAN:   68 year old female with a history of asthma and essential hypertension who presented to the emergency room due to shortness of breath and cough.   1.   Acute moderate intermittent asthma exacerbation triggered by influenza A: Patient appears to be developing associated acute bronchitis.  Added empiric antibiotics with levofloxacin with plans for 5-day treatment duration. Patient gradually improving but still has significant wheezing and rales on exam. Continue IV Solu-Medrol 40 mg every 12 hourly for at least next 24 hours.  Not clinically stable enough for discharge home yet Continue nebs/inhalers She will need an inhaler such as Advair prior to discharge. Patient also will benefit from nebulizer treatments and nebulizer machine on discharge *Chest x-ray done this morning with no acute findings.  2.  Influenza A: Continue Tamiflu for total 5 days  3.  Essential hypertension: Continue rest, metoprolol, lisinopril and HCTZ  4.  Morbid obesity: Encouraged weight loss as tolerated  Management plans discussed with the patient and she is in agreement.  CODE STATUS: full  TOTAL TIME TAKING CARE OF THIS PATIENT: 32 minutes.   DVT prophylaxis; Lovenox  POSSIBLE D/C am, DEPENDING ON CLINICAL CONDITION.   Donald Jacque M.D on 01/16/2018 at 2:00 PM  Between 7am to 6pm - Pager - [phone] After 6pm go to [email or web address] - password [email or web address]  [facility] [place] [facility]  Office  [phone]  CC: Primary care physician; System, Pcp Not In  Note: This dictation was prepared with Dragon dictation along with smaller phrase technology. Any transcriptional errors that result from this process are unintentional.

## 2018-01-17 LAB — BASIC METABOLIC PANEL
Anion gap: 8 (ref 5–15)
BUN: 17 mg/dL (ref 8–23)
CO2: 28 mmol/L (ref 22–32)
Calcium: 9.4 mg/dL (ref 8.9–10.3)
Chloride: 101 mmol/L (ref 98–111)
Creatinine, Ser: 0.5 mg/dL (ref 0.44–1.00)
GFR calc Af Amer: >60 mL/min (ref >60)
GFR calc non Af Amer: >60 mL/min (ref >60)
Glucose, Bld: 177 mg/dL — ABNORMAL HIGH (ref 70–99)
Potassium: 4 mmol/L (ref 3.5–5.1)
Sodium: 137 mmol/L (ref 135–145)

## 2018-01-17 LAB — MAGNESIUM: Magnesium: 2 mg/dL (ref 1.7–2.4)

## 2018-01-17 MED ORDER — DEXAMETHASONE 0.5 MG PO TABS: 2.0000 mg | ORAL_TABLET | Freq: Every day | ORAL | 0 refills | Status: AC

## 2018-01-17 MED ORDER — GUAIFENESIN ER 600 MG PO TB12: 600.0000 mg | ORAL_TABLET | Freq: Two times a day (BID) | ORAL | 0 refills | Status: AC

## 2018-01-17 MED ORDER — ALBUTEROL SULFATE (2.5 MG/3ML) 0.083% IN NEBU: 2.5000 mg | INHALATION_SOLUTION | RESPIRATORY_TRACT | 0 refills | Status: AC | PRN

## 2018-01-17 MED ORDER — LEVOFLOXACIN 500 MG PO TABS: 500.0000 mg | ORAL_TABLET | Freq: Every day | ORAL | 0 refills | Status: AC

## 2018-01-17 MED ORDER — OSELTAMIVIR PHOSPHATE 75 MG PO CAPS: 75.0000 mg | ORAL_CAPSULE | Freq: Two times a day (BID) | ORAL | 0 refills | Status: AC

## 2018-01-17 NOTE — Progress Notes (Signed)
Patient is being discharged to home this afternoon. IV removed. RX & DC instructions given and patient acknowledged understanding.

## 2018-01-17 NOTE — Discharge Summary (Signed)
[facility] - [place] at [facility]   PATIENT NAME: Shannon Bennett    MR#:  [id number]  DATE OF BIRTH:  1950/07/11  DATE OF ADMISSION:  01/13/2018   ADMITTING PHYSICIAN: Auburn Bilberry, MD  DATE OF DISCHARGE: 01/17/2018.  PRIMARY CARE PHYSICIAN: System, Pcp Not In   ADMISSION DIAGNOSIS:  Wheezing [R06.2] Hypoxia [R09.02] Influenza [J11.1] DISCHARGE DIAGNOSIS:  Active Problems:   SOB (shortness of breath)   Asthma exacerbation  SECONDARY DIAGNOSIS:   Past Medical History:  Diagnosis Date  . Asthma   . Depression   . Hypertension    HOSPITAL COURSE:   HISTORY OF PRESENT ILLNESS: Shannon Bennett  is a 68 y.o. female with a known history of asthma, depression, hypertension who is presenting to the hospital with cough and shortness of breath ongoing for the past few days.  Patient states that her cough started about 1 week ago and has progressively gotten worse.  Is nonproductive in nature.  She also has had fevers and chills.  Patient over the last few days has had shortness of breath and wheezing.    Patient was diagnosed with asthma exacerbation.  Referred to H&P for further details.   HOSPITAL COURSE 1.  Acute moderate intermittent asthma exacerbation triggered by influenza A: Patient appears to be developing associated acute bronchitis.  Added empiric antibiotics with levofloxacin with plans for 5-day treatment duration.  Was treated with Tamiflu.  Has received 9 doses already.  Prescription for last dose given to complete total of 10 doses.  Was also treated with IV Solu-Medrol during this admission.  Patient reports significant improvement.  Patient being discharged on p.o. prednisone taper and empiric antibiotics with levofloxacin due to associated acute bronchitis.  Recent chest x-ray with no acute findings.  Clinically hemodynamically stable for discharge.  Prescription for nebulizer machine and albuterol nebulizer treatments also given.  Continued at  home inhalers.  Follow-up with primary care physician  2.  Influenza A: To complete total of 5 days treatment duration.  3.  Essential hypertension: Continue  metoprolol, lisinopril and HCTZ.  Outpatient monitoring by primary care physician  4.  Morbid obesity: Encouraged weight loss as tolerated  Management plans discussed with the patient and she is in agreement.  CODE STATUS: full   DISCHARGE CONDITIONS:  Stable CONSULTS OBTAINED:   DRUG ALLERGIES:   Allergies  Allergen Reactions  . Codeine   . Codeine Phosphate     REACTION: unspecified  . Meperidine Hcl   . Prednisone     REACTION: "red effect"  . Sulfamethoxazole     REACTION: rash  . Sulfonamide Derivatives    DISCHARGE MEDICATIONS:   Allergies as of 01/17/2018      Reactions   Codeine    Codeine Phosphate    REACTION: unspecified   Meperidine Hcl    Prednisone    REACTION: "red effect"   Sulfamethoxazole    REACTION: rash   Sulfonamide Derivatives       Medication List    STOP taking these medications   omeprazole 20 MG tablet Commonly known as:  PRILOSEC OTC   VENTOLIN HFA 108 (90 Base) MCG/ACT inhaler Generic drug:  albuterol Replaced by:  albuterol (2.5 MG/3ML) 0.083% nebulizer solution     TAKE these medications   ADVAIR DISKUS 250-50 MCG/DOSE Aepb Generic drug:  Fluticasone-Salmeterol Inhale 1 puff into the lungs 2 (two) times daily.   albuterol (2.5 MG/3ML) 0.083% nebulizer solution Commonly known as:  PROVENTIL Take 3 mLs (  2.5 mg total) by nebulization every 4 (four) hours as needed for wheezing or shortness of breath. Replaces:  VENTOLIN HFA 108 (90 Base) MCG/ACT inhaler   amLODipine 10 MG tablet Commonly known as:  NORVASC Take 10 mg by mouth daily.   aspirin EC 81 MG tablet Take 81 mg by mouth daily.   dexamethasone 0.5 MG tablet Commonly known as:  DECADRON Take 4 tablets (2 mg total) by mouth daily.   FLONASE 50 MCG/ACT nasal spray Generic drug:  fluticasone 2  sprays by Nasal route daily.   guaiFENesin 600 MG 12 hr tablet Commonly known as:  MUCINEX Take 1 tablet (600 mg total) by mouth 2 (two) times daily.   hydrochlorothiazide 25 MG tablet Commonly known as:  HYDRODIURIL Take 25 mg by mouth daily.   HYZAAR 100-25 MG tablet Generic drug:  losartan-hydrochlorothiazide Take 1 tablet by mouth daily.   levofloxacin 500 MG tablet Commonly known as:  LEVAQUIN Take 1 tablet (500 mg total) by mouth daily for 5 days.   lisinopril 20 MG tablet Commonly known as:  PRINIVIL,ZESTRIL Take 20 mg by mouth daily.   metoprolol tartrate 50 MG tablet Commonly known as:  LOPRESSOR Take 1 tablet (50 mg total) by mouth 2 (two) times daily.   montelukast 10 MG tablet Commonly known as:  SINGULAIR Take 10 mg by mouth at bedtime.   oseltamivir 75 MG capsule Commonly known as:  TAMIFLU Take 1 capsule (75 mg total) by mouth 2 (two) times daily.   pantoprazole 40 MG tablet Commonly known as:  PROTONIX Take 40 mg by mouth daily.   PARoxetine 20 MG tablet Commonly known as:  PAXIL Take 20 mg by mouth daily.   simvastatin 20 MG tablet Commonly known as:  ZOCOR Take 20 mg by mouth at bedtime.   vitamin E 100 UNIT capsule Take 100 Units by mouth daily.   ZYRTEC ALLERGY 10 MG tablet Generic drug:  cetirizine Take 10 mg by mouth at bedtime.            Durable Medical Equipment  (From admission, onward)         Start     Ordered   01/17/18 0000  DME Nebulizer machine    Comments:  Case manager to assist with patient having nebulizer machine on discharge  Question:  Patient needs a nebulizer to treat with the following condition  Answer:  Asthma   01/17/18 1301           DISCHARGE INSTRUCTIONS:   DIET:  Cardiac diet DISCHARGE CONDITION:  Stable ACTIVITY:  Activity as tolerated OXYGEN:  Home Oxygen: No.  Oxygen Delivery: room air DISCHARGE LOCATION:  home   If you experience worsening of your admission symptoms, develop  shortness of breath, life threatening emergency, suicidal or homicidal thoughts you must seek medical attention immediately by calling 911 or calling your MD immediately  if symptoms less severe.  You Must read complete instructions/literature along with all the possible adverse reactions/side effects for all the Medicines you take and that have been prescribed to you. Take any new Medicines after you have completely understood and accpet all the possible adverse reactions/side effects.   Please note  You were cared for by a hospitalist during your hospital stay. If you have any questions about your discharge medications or the care you received while you were in the hospital after you are discharged, you can call the unit and asked to speak with the hospitalist on call if the hospitalist  that took care of you is not available. Once you are discharged, your primary care physician will handle any further medical issues. Please note that NO REFILLS for any discharge medications will be authorized once you are discharged, as it is imperative that you return to your primary care physician (or establish a relationship with a primary care physician if you do not have one) for your aftercare needs so that they can reassess your need for medications and monitor your lab values.    On the day of Discharge:  VITAL SIGNS:  Blood pressure (!) 171/92, pulse 91, temperature 97.9 F (36.6 C), temperature source Oral, resp. rate 18, height 4\' 11"  (1.499 m), weight 93 kg, SpO2 94 %. PHYSICAL EXAMINATION:  GENERAL:  68 y.o.-year-old patient lying in the bed with no acute distress.  EYES: Pupils equal, round, reactive to light and accommodation. No scleral icterus. Extraocular muscles intact.  HEENT: Head atraumatic, normocephalic. Oropharynx and nasopharynx clear.  NECK:  Supple, no jugular venous distention. No thyroid enlargement, no tenderness.  LUNGS: Normal breath sounds bilaterally, no wheezing, rales,rhonchi  or crepitation. CARDIOVASCULAR: S1, S2 normal. No murmurs, rubs, or gallops.  ABDOMEN: Soft, non-tender, non-distended. Bowel sounds present. No organomegaly or mass.  EXTREMITIES: No pedal edema, cyanosis, or clubbing.  NEUROLOGIC: Cranial nerves II through XII are intact. Muscle strength 5/5 in all extremities. Sensation intact. Gait not checked.  PSYCHIATRIC: The patient is alert and oriented x 3.  SKIN: No obvious rash, lesion,   DATA REVIEW:   CBC Recent Labs  Lab 01/13/18 0936  WBC 6.3  HGB 15.6*  HCT 46.2*  PLT 220    Chemistries  Recent Labs  Lab 01/13/18 0936  01/17/18 0515  NA 141   < > 137  K 3.2*   < > 4.0  CL 106   < > 101  CO2 25   < > 28  GLUCOSE 142*   < > 177*  BUN 10   < > 17  CREATININE 0.61   < > 0.50  CALCIUM 9.3   < > 9.4  MG  --    < > 2.0  AST 24  --   --   ALT 19  --   --   ALKPHOS 83  --   --   BILITOT 0.9  --   --    < > = values in this interval not displayed.     Microbiology Results  No results found for this or any previous visit.  RADIOLOGY:  No results found.   Management plans discussed with the patient, family and they are in agreement.  CODE STATUS: Full Code   TOTAL TIME TAKING CARE OF THIS PATIENT: 36 minutes.    Cathlin Buchan M.D on 01/17/2018 at 1:01 PM  Between 7am to 6pm - Pager - [phone]  After 6pm go to [email or web address] - Social research officer, government  [facility]  Office  [phone]  CC: Primary care physician; System, Pcp Not In   Note: This dictation was prepared with Dragon dictation along with smaller phrase technology. Any transcriptional errors that result from this process are unintentional.

## 2018-01-17 NOTE — Care Management Important Message (Signed)
Important Message  Patient Details  Name: CORNEISHA ASKELSON MRN: [id number] Date of Birth: [birth year]   Medicare Important Message Given:  Yes    Barrie Dunker, RN 01/17/2018, 11:12 AM

## 2018-01-17 NOTE — Plan of Care (Signed)
Problem: Clinical Measurements: Goal: Ability to maintain clinical measurements within normal limits will improve Outcome: Progressing Goal: Will remain free from infection Outcome: Progressing Goal: Respiratory complications will improve Outcome: Progressing   Problem: Activity: Goal: Risk for activity intolerance will decrease Outcome: Progressing   

## 2018-01-20 ENCOUNTER — Telehealth: Payer: Self-pay

## 2018-01-20 NOTE — Telephone Encounter (Signed)
EMMI Follow-up: Noted on the report that the patient did not have a follow-up appointment scheduled. After Visit Summary shows for patient to follow-up with [facility] within 5 days so left my contact information and clinic phone number on her voicemail.  On the message, I let her know there would be a second automated call with a different series of questions in case she received that call before I heard back from her.

## 2018-01-23 ENCOUNTER — Telehealth: Payer: Self-pay | Admitting: Licensed Clinical Social Worker

## 2018-01-23 NOTE — Telephone Encounter (Signed)
EMMI flagged patient for answering yes to loss of interest in things. Clinical Social Worker (CSW) attempted to contact patient via telephone however she did not answer and a voicemail was left.   Arrion Broaddus, LCSW [phone]  

## 2018-01-24 NOTE — Telephone Encounter (Signed)
Clinical Child psychotherapist (CSW) attempted to contact patient for a second time however she did not answer and a voicemail was left.   [employer], LCSW [phone]

## 2018-11-24 ENCOUNTER — Other Ambulatory Visit: Payer: Self-pay

## 2018-11-24 DIAGNOSIS — Z20822 Contact with and (suspected) exposure to covid-19: Secondary | ICD-10-CM

## 2018-11-24 DIAGNOSIS — Z20828 Contact with and (suspected) exposure to other viral communicable diseases: Secondary | ICD-10-CM

## 2018-11-25 LAB — NOVEL CORONAVIRUS, NAA: SARS-CoV-2, NAA: NOT DETECTED

## 2019-03-26 ENCOUNTER — Ambulatory Visit: Payer: Medicare Other | Attending: Internal Medicine

## 2019-09-22 IMAGING — CR DG CHEST 2V
1 series · 2 of 2 positions shown · non-contrast
Comparison: 12/22/2008.

CLINICAL DATA: Cough and shortness of breath.

EXAM:
CHEST - 2 VIEW

[Series 1: dg chest 2 view · 0.14mm/px · 2 of 2 slices shown]
[im 1/2]
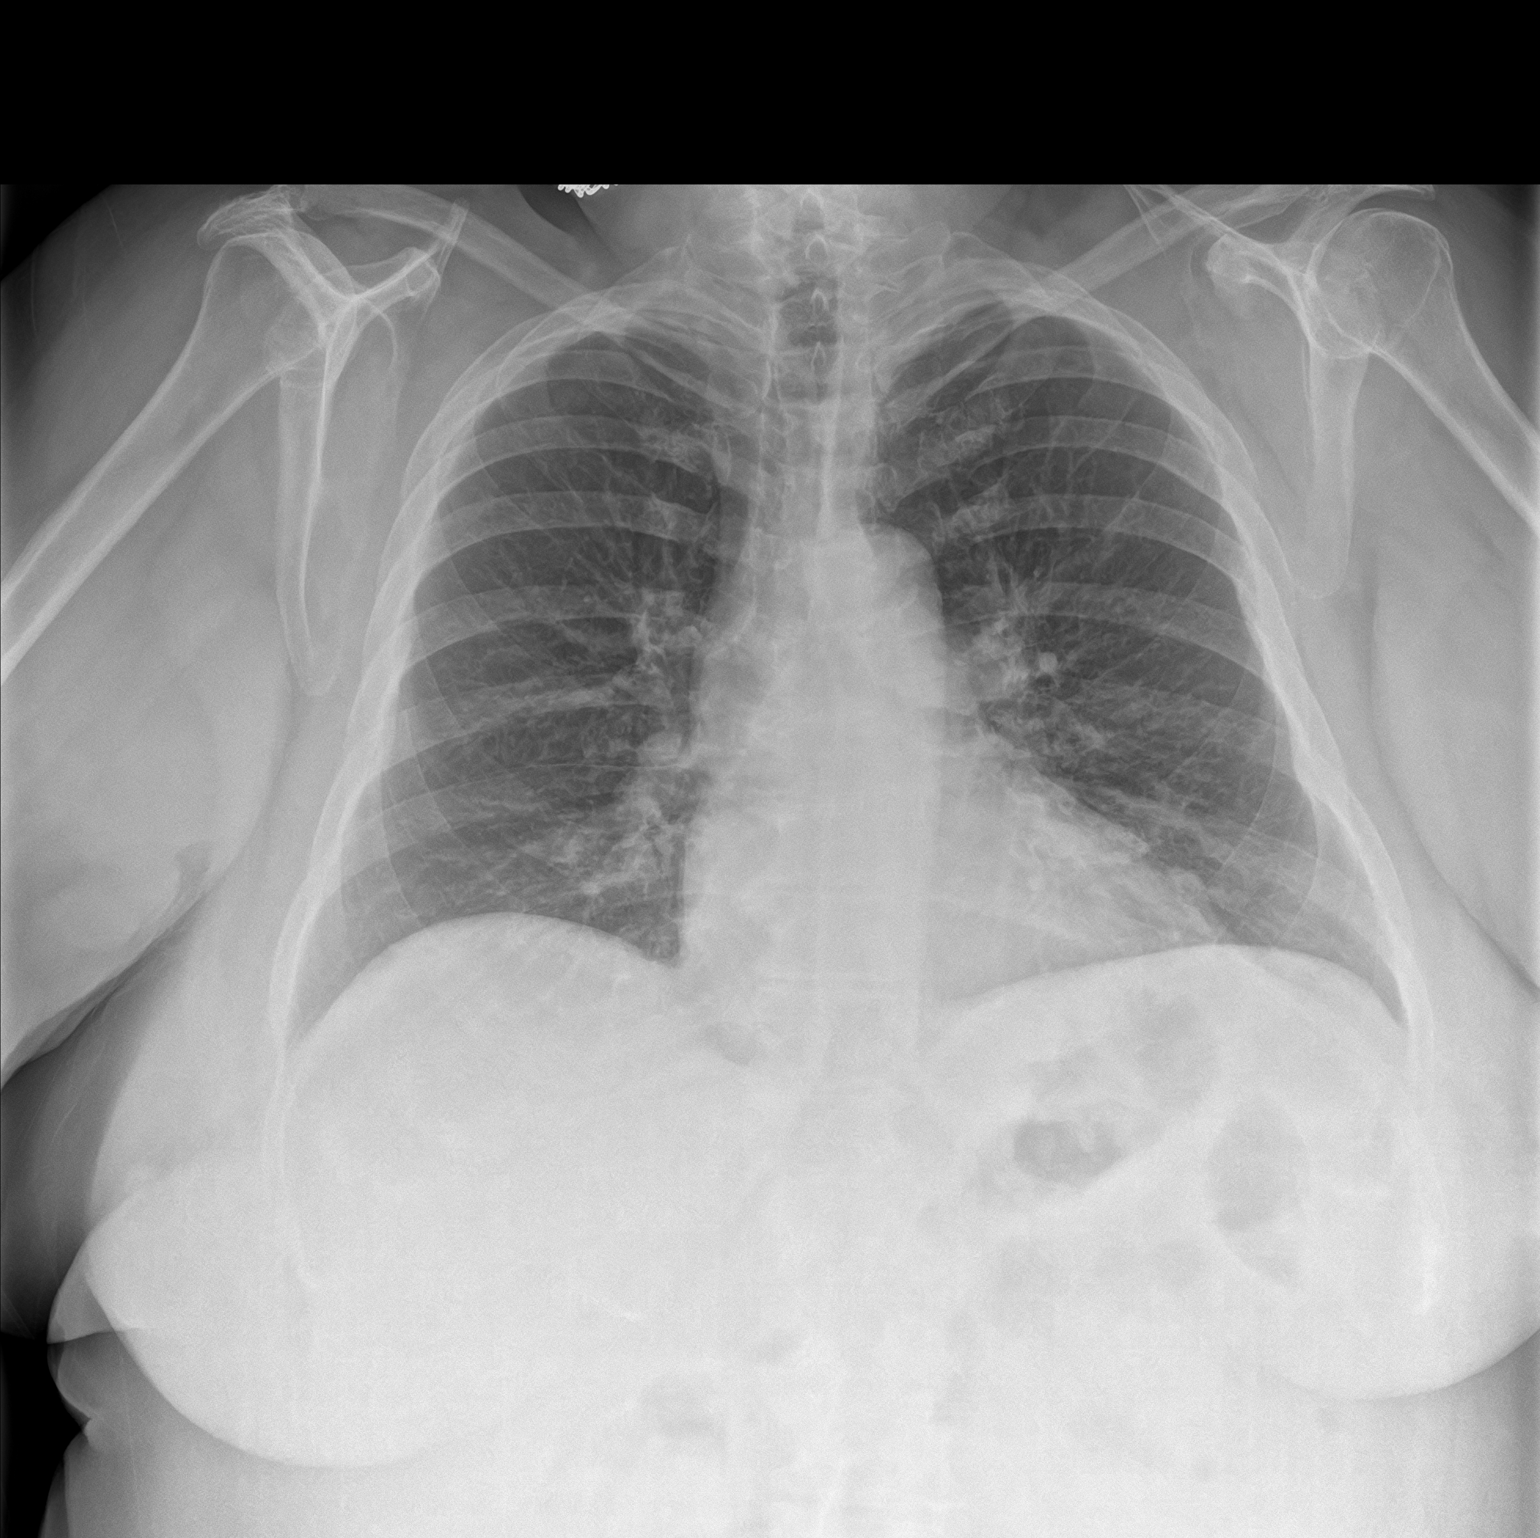
[im 2/2]
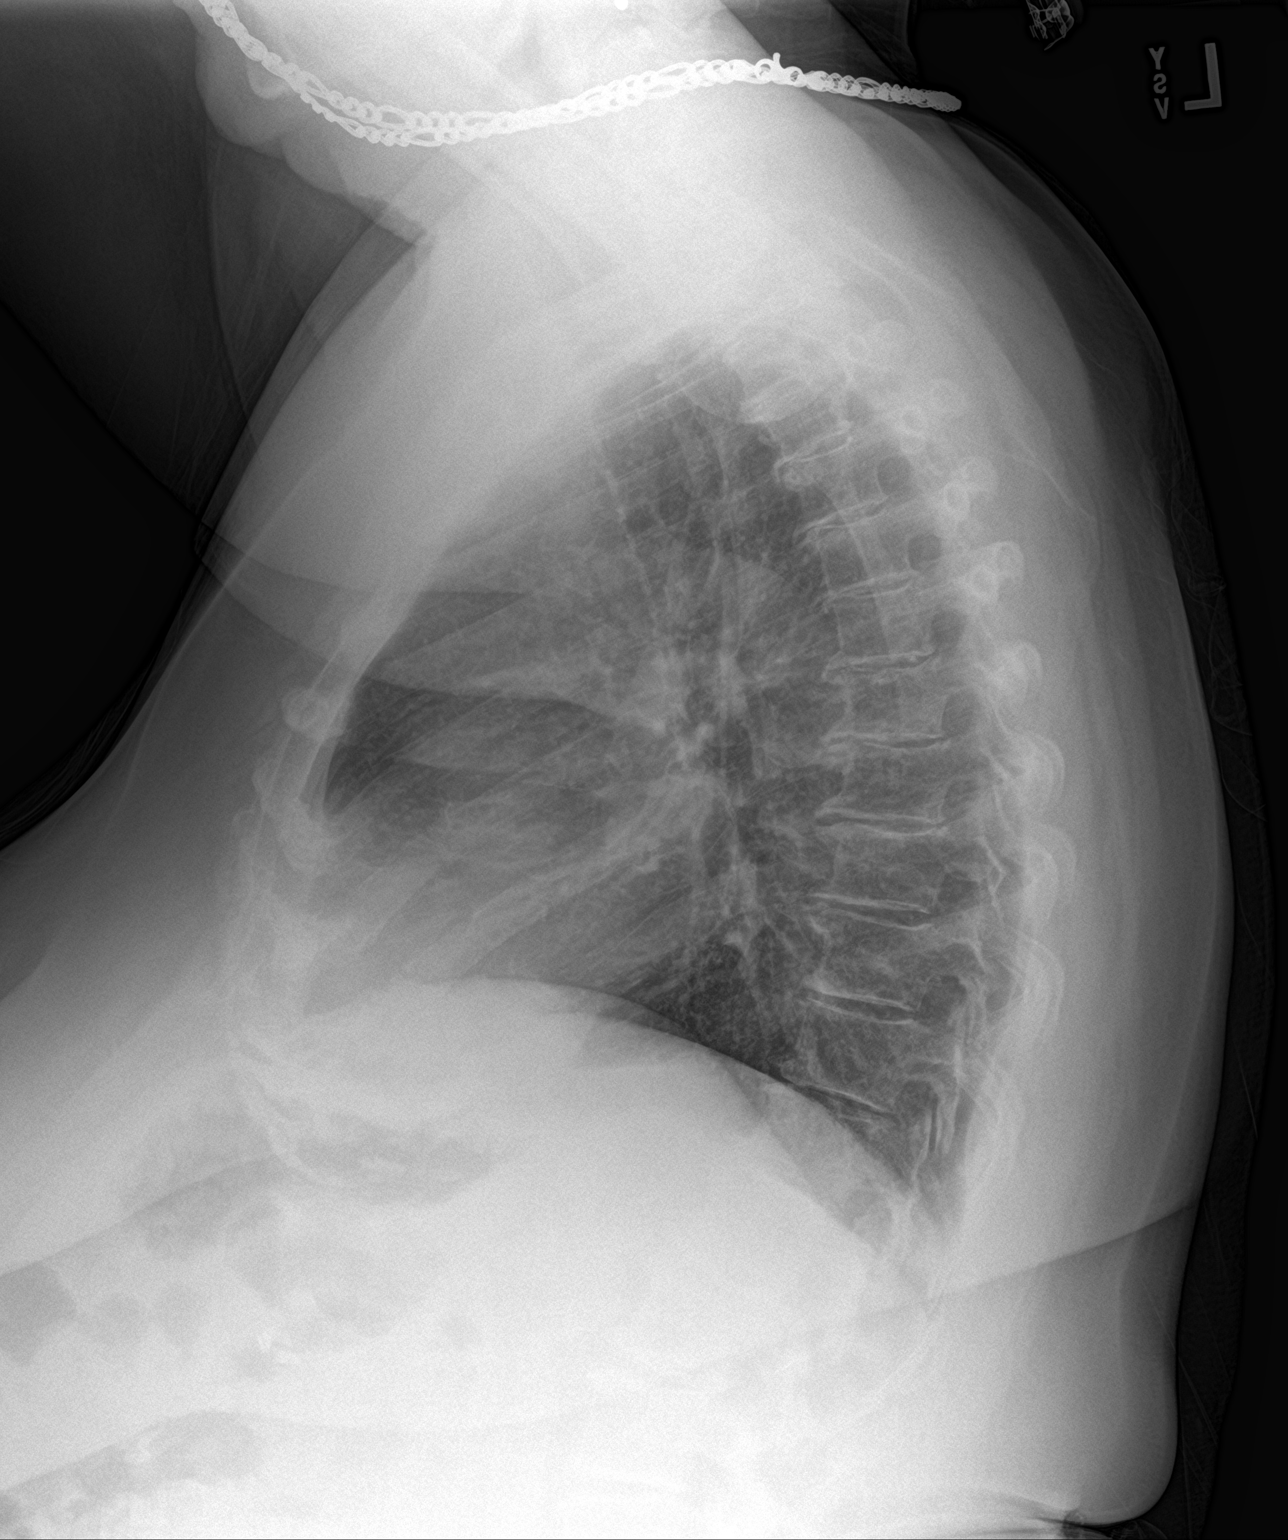

[2 of 2 positions shown; findings below may reference images not displayed]

FINDINGS: Mediastinum hilar structures normal. Low lung volumes with mild
bibasilar atelectasis. No pleural effusion pneumothorax. Heart size
normal. Degenerative change thoracic spine.
IMPRESSION: Low lung volumes with mild bibasilar atelectasis.

## 2020-02-02 ENCOUNTER — Other Ambulatory Visit: Payer: Self-pay | Admitting: Family Medicine

## 2020-02-02 DIAGNOSIS — Z1231 Encounter for screening mammogram for malignant neoplasm of breast: Secondary | ICD-10-CM

## 2020-10-24 ENCOUNTER — Other Ambulatory Visit: Payer: Self-pay | Admitting: Family Medicine

## 2020-10-24 DIAGNOSIS — Z13820 Encounter for screening for osteoporosis: Secondary | ICD-10-CM

## 2020-10-24 DIAGNOSIS — Z1231 Encounter for screening mammogram for malignant neoplasm of breast: Secondary | ICD-10-CM

## 2021-06-20 DIAGNOSIS — E782 Mixed hyperlipidemia: Secondary | ICD-10-CM | POA: Insufficient documentation

## 2021-06-20 DIAGNOSIS — F418 Other specified anxiety disorders: Secondary | ICD-10-CM | POA: Insufficient documentation

## 2021-06-21 DIAGNOSIS — Z79899 Other long term (current) drug therapy: Secondary | ICD-10-CM | POA: Diagnosis not present

## 2021-06-21 DIAGNOSIS — K219 Gastro-esophageal reflux disease without esophagitis: Secondary | ICD-10-CM | POA: Diagnosis not present

## 2021-06-21 DIAGNOSIS — G43909 Migraine, unspecified, not intractable, without status migrainosus: Secondary | ICD-10-CM | POA: Diagnosis not present

## 2021-06-21 DIAGNOSIS — I10 Essential (primary) hypertension: Secondary | ICD-10-CM | POA: Diagnosis not present

## 2021-06-21 DIAGNOSIS — Z1239 Encounter for other screening for malignant neoplasm of breast: Secondary | ICD-10-CM | POA: Diagnosis not present

## 2021-06-21 DIAGNOSIS — J45909 Unspecified asthma, uncomplicated: Secondary | ICD-10-CM | POA: Diagnosis not present

## 2021-06-21 DIAGNOSIS — F418 Other specified anxiety disorders: Secondary | ICD-10-CM | POA: Diagnosis not present

## 2021-06-21 DIAGNOSIS — Z6841 Body Mass Index (BMI) 40.0 and over, adult: Secondary | ICD-10-CM | POA: Diagnosis not present

## 2021-06-21 DIAGNOSIS — E782 Mixed hyperlipidemia: Secondary | ICD-10-CM | POA: Diagnosis not present

## 2021-06-28 DIAGNOSIS — E782 Mixed hyperlipidemia: Secondary | ICD-10-CM | POA: Diagnosis not present

## 2021-06-28 DIAGNOSIS — Z6841 Body Mass Index (BMI) 40.0 and over, adult: Secondary | ICD-10-CM | POA: Diagnosis not present

## 2021-06-28 DIAGNOSIS — J45909 Unspecified asthma, uncomplicated: Secondary | ICD-10-CM | POA: Diagnosis not present

## 2021-06-28 DIAGNOSIS — J40 Bronchitis, not specified as acute or chronic: Secondary | ICD-10-CM | POA: Insufficient documentation

## 2021-06-28 DIAGNOSIS — Z0001 Encounter for general adult medical examination with abnormal findings: Secondary | ICD-10-CM | POA: Diagnosis not present

## 2021-06-28 DIAGNOSIS — K219 Gastro-esophageal reflux disease without esophagitis: Secondary | ICD-10-CM | POA: Diagnosis not present

## 2021-06-28 DIAGNOSIS — G43909 Migraine, unspecified, not intractable, without status migrainosus: Secondary | ICD-10-CM | POA: Diagnosis not present

## 2021-06-28 DIAGNOSIS — I10 Essential (primary) hypertension: Secondary | ICD-10-CM | POA: Diagnosis not present

## 2021-06-28 DIAGNOSIS — F418 Other specified anxiety disorders: Secondary | ICD-10-CM | POA: Diagnosis not present

## 2021-07-11 ENCOUNTER — Other Ambulatory Visit (HOSPITAL_COMMUNITY): Payer: Self-pay | Admitting: Nurse Practitioner

## 2021-07-11 DIAGNOSIS — Z1231 Encounter for screening mammogram for malignant neoplasm of breast: Secondary | ICD-10-CM

## 2021-07-20 DIAGNOSIS — Z0100 Encounter for examination of eyes and vision without abnormal findings: Secondary | ICD-10-CM | POA: Diagnosis not present

## 2021-07-20 DIAGNOSIS — H5213 Myopia, bilateral: Secondary | ICD-10-CM | POA: Diagnosis not present

## 2021-08-21 ENCOUNTER — Ambulatory Visit (HOSPITAL_COMMUNITY): Payer: Medicare Other

## 2021-08-28 ENCOUNTER — Ambulatory Visit (HOSPITAL_COMMUNITY): Payer: Self-pay

## 2021-10-10 ENCOUNTER — Other Ambulatory Visit (HOSPITAL_COMMUNITY): Payer: Self-pay | Admitting: Nurse Practitioner

## 2021-11-06 ENCOUNTER — Inpatient Hospital Stay
Admission: RE | Admit: 2021-11-06 | Discharge: 2021-11-06 | Disposition: A | Discharge status: Home / Self Care | Payer: Self-pay | Source: Ambulatory Visit | Attending: Nurse Practitioner | Admitting: Nurse Practitioner

## 2021-11-06 ENCOUNTER — Other Ambulatory Visit (HOSPITAL_COMMUNITY): Payer: Self-pay | Admitting: Nurse Practitioner

## 2021-11-06 ENCOUNTER — Ambulatory Visit
Admission: RE | Admit: 2021-11-06 | Discharge: 2021-11-06 | Disposition: A | Discharge status: Home / Self Care | Payer: Self-pay | Source: Ambulatory Visit | Attending: Nurse Practitioner | Admitting: Nurse Practitioner

## 2021-11-06 DIAGNOSIS — N6001 Solitary cyst of right breast: Secondary | ICD-10-CM

## 2021-11-06 DIAGNOSIS — Z1231 Encounter for screening mammogram for malignant neoplasm of breast: Secondary | ICD-10-CM

## 2021-11-21 ENCOUNTER — Inpatient Hospital Stay (HOSPITAL_COMMUNITY): Admission: RE | Admit: 2021-11-21 | Payer: Medicare HMO | Source: Ambulatory Visit

## 2021-11-21 ENCOUNTER — Ambulatory Visit (HOSPITAL_COMMUNITY): Payer: Medicare HMO

## 2021-12-12 ENCOUNTER — Ambulatory Visit (HOSPITAL_COMMUNITY): Payer: Medicare HMO

## 2021-12-12 ENCOUNTER — Encounter (HOSPITAL_COMMUNITY): Payer: Self-pay

## 2021-12-12 ENCOUNTER — Encounter (HOSPITAL_COMMUNITY): Payer: Medicare HMO

## 2021-12-26 DIAGNOSIS — F418 Other specified anxiety disorders: Secondary | ICD-10-CM | POA: Diagnosis not present

## 2021-12-26 DIAGNOSIS — K219 Gastro-esophageal reflux disease without esophagitis: Secondary | ICD-10-CM | POA: Diagnosis not present

## 2021-12-26 DIAGNOSIS — I10 Essential (primary) hypertension: Secondary | ICD-10-CM | POA: Diagnosis not present

## 2022-01-02 DIAGNOSIS — I10 Essential (primary) hypertension: Secondary | ICD-10-CM | POA: Diagnosis not present

## 2022-01-02 DIAGNOSIS — R748 Abnormal levels of other serum enzymes: Secondary | ICD-10-CM | POA: Insufficient documentation

## 2022-01-02 DIAGNOSIS — J45909 Unspecified asthma, uncomplicated: Secondary | ICD-10-CM | POA: Diagnosis not present

## 2022-01-02 DIAGNOSIS — R7301 Impaired fasting glucose: Secondary | ICD-10-CM | POA: Insufficient documentation

## 2022-01-02 DIAGNOSIS — M25531 Pain in right wrist: Secondary | ICD-10-CM | POA: Insufficient documentation

## 2022-01-02 DIAGNOSIS — E782 Mixed hyperlipidemia: Secondary | ICD-10-CM | POA: Diagnosis not present

## 2022-01-02 DIAGNOSIS — Z0001 Encounter for general adult medical examination with abnormal findings: Secondary | ICD-10-CM | POA: Diagnosis not present

## 2022-01-02 DIAGNOSIS — F418 Other specified anxiety disorders: Secondary | ICD-10-CM | POA: Diagnosis not present

## 2022-01-02 DIAGNOSIS — R7303 Prediabetes: Secondary | ICD-10-CM | POA: Insufficient documentation

## 2022-01-02 DIAGNOSIS — G43909 Migraine, unspecified, not intractable, without status migrainosus: Secondary | ICD-10-CM | POA: Diagnosis not present

## 2022-01-02 DIAGNOSIS — K219 Gastro-esophageal reflux disease without esophagitis: Secondary | ICD-10-CM | POA: Diagnosis not present

## 2022-01-03 ENCOUNTER — Other Ambulatory Visit (HOSPITAL_COMMUNITY): Payer: Self-pay | Admitting: Nurse Practitioner

## 2022-01-03 DIAGNOSIS — M25531 Pain in right wrist: Secondary | ICD-10-CM

## 2022-01-16 DIAGNOSIS — R062 Wheezing: Secondary | ICD-10-CM | POA: Diagnosis not present

## 2022-01-16 DIAGNOSIS — R059 Cough, unspecified: Secondary | ICD-10-CM | POA: Diagnosis not present

## 2022-01-16 DIAGNOSIS — J019 Acute sinusitis, unspecified: Secondary | ICD-10-CM | POA: Diagnosis not present

## 2022-01-16 DIAGNOSIS — Z87891 Personal history of nicotine dependence: Secondary | ICD-10-CM | POA: Diagnosis not present

## 2022-02-22 DIAGNOSIS — Z1231 Encounter for screening mammogram for malignant neoplasm of breast: Secondary | ICD-10-CM | POA: Diagnosis not present

## 2022-03-07 DIAGNOSIS — F418 Other specified anxiety disorders: Secondary | ICD-10-CM | POA: Diagnosis not present

## 2022-03-07 DIAGNOSIS — F5104 Psychophysiologic insomnia: Secondary | ICD-10-CM | POA: Diagnosis not present

## 2022-03-20 DIAGNOSIS — R928 Other abnormal and inconclusive findings on diagnostic imaging of breast: Secondary | ICD-10-CM | POA: Diagnosis not present

## 2022-04-12 DIAGNOSIS — R300 Dysuria: Secondary | ICD-10-CM | POA: Diagnosis not present

## 2022-04-12 DIAGNOSIS — M545 Low back pain, unspecified: Secondary | ICD-10-CM | POA: Diagnosis not present

## 2022-04-12 DIAGNOSIS — R3915 Urgency of urination: Secondary | ICD-10-CM | POA: Diagnosis not present

## 2022-04-12 DIAGNOSIS — R350 Frequency of micturition: Secondary | ICD-10-CM | POA: Diagnosis not present

## 2022-06-27 DIAGNOSIS — I10 Essential (primary) hypertension: Secondary | ICD-10-CM | POA: Diagnosis not present

## 2022-06-27 DIAGNOSIS — K219 Gastro-esophageal reflux disease without esophagitis: Secondary | ICD-10-CM | POA: Diagnosis not present

## 2022-06-27 DIAGNOSIS — F418 Other specified anxiety disorders: Secondary | ICD-10-CM | POA: Diagnosis not present

## 2022-07-10 DIAGNOSIS — R7303 Prediabetes: Secondary | ICD-10-CM | POA: Diagnosis not present

## 2022-07-10 DIAGNOSIS — R748 Abnormal levels of other serum enzymes: Secondary | ICD-10-CM | POA: Diagnosis not present

## 2022-07-10 DIAGNOSIS — F418 Other specified anxiety disorders: Secondary | ICD-10-CM | POA: Diagnosis not present

## 2022-07-10 DIAGNOSIS — G43909 Migraine, unspecified, not intractable, without status migrainosus: Secondary | ICD-10-CM | POA: Diagnosis not present

## 2022-07-10 DIAGNOSIS — E782 Mixed hyperlipidemia: Secondary | ICD-10-CM | POA: Diagnosis not present

## 2022-07-10 DIAGNOSIS — I10 Essential (primary) hypertension: Secondary | ICD-10-CM | POA: Diagnosis not present

## 2022-07-10 DIAGNOSIS — N289 Disorder of kidney and ureter, unspecified: Secondary | ICD-10-CM | POA: Insufficient documentation

## 2022-07-10 DIAGNOSIS — J4541 Moderate persistent asthma with (acute) exacerbation: Secondary | ICD-10-CM | POA: Diagnosis not present

## 2022-07-10 DIAGNOSIS — K219 Gastro-esophageal reflux disease without esophagitis: Secondary | ICD-10-CM | POA: Diagnosis not present

## 2022-07-10 DIAGNOSIS — R058 Other specified cough: Secondary | ICD-10-CM | POA: Insufficient documentation

## 2022-07-26 DIAGNOSIS — H10021 Other mucopurulent conjunctivitis, right eye: Secondary | ICD-10-CM | POA: Diagnosis not present

## 2022-07-26 DIAGNOSIS — Z87891 Personal history of nicotine dependence: Secondary | ICD-10-CM | POA: Diagnosis not present

## 2022-07-26 DIAGNOSIS — J4541 Moderate persistent asthma with (acute) exacerbation: Secondary | ICD-10-CM | POA: Diagnosis not present

## 2022-10-16 DIAGNOSIS — F418 Other specified anxiety disorders: Secondary | ICD-10-CM | POA: Diagnosis not present

## 2022-10-16 DIAGNOSIS — K219 Gastro-esophageal reflux disease without esophagitis: Secondary | ICD-10-CM | POA: Diagnosis not present

## 2022-10-16 DIAGNOSIS — I10 Essential (primary) hypertension: Secondary | ICD-10-CM | POA: Diagnosis not present

## 2022-10-22 DIAGNOSIS — F418 Other specified anxiety disorders: Secondary | ICD-10-CM | POA: Diagnosis not present

## 2022-10-22 DIAGNOSIS — K219 Gastro-esophageal reflux disease without esophagitis: Secondary | ICD-10-CM | POA: Diagnosis not present

## 2022-10-22 DIAGNOSIS — J45909 Unspecified asthma, uncomplicated: Secondary | ICD-10-CM | POA: Diagnosis not present

## 2022-10-22 DIAGNOSIS — Z23 Encounter for immunization: Secondary | ICD-10-CM | POA: Diagnosis not present

## 2022-10-22 DIAGNOSIS — E782 Mixed hyperlipidemia: Secondary | ICD-10-CM | POA: Diagnosis not present

## 2022-10-22 DIAGNOSIS — B372 Candidiasis of skin and nail: Secondary | ICD-10-CM | POA: Insufficient documentation

## 2022-10-22 DIAGNOSIS — I10 Essential (primary) hypertension: Secondary | ICD-10-CM | POA: Diagnosis not present

## 2022-10-22 DIAGNOSIS — B3731 Acute candidiasis of vulva and vagina: Secondary | ICD-10-CM | POA: Insufficient documentation

## 2022-10-22 DIAGNOSIS — G43909 Migraine, unspecified, not intractable, without status migrainosus: Secondary | ICD-10-CM | POA: Diagnosis not present

## 2022-10-22 DIAGNOSIS — G252 Other specified forms of tremor: Secondary | ICD-10-CM | POA: Insufficient documentation

## 2023-01-23 DIAGNOSIS — R059 Cough, unspecified: Secondary | ICD-10-CM | POA: Diagnosis not present

## 2023-01-23 DIAGNOSIS — J22 Unspecified acute lower respiratory infection: Secondary | ICD-10-CM | POA: Insufficient documentation

## 2023-01-23 DIAGNOSIS — J45909 Unspecified asthma, uncomplicated: Secondary | ICD-10-CM | POA: Diagnosis not present

## 2023-02-18 DIAGNOSIS — I10 Essential (primary) hypertension: Secondary | ICD-10-CM | POA: Diagnosis not present

## 2023-02-18 DIAGNOSIS — K219 Gastro-esophageal reflux disease without esophagitis: Secondary | ICD-10-CM | POA: Diagnosis not present

## 2023-02-18 DIAGNOSIS — F418 Other specified anxiety disorders: Secondary | ICD-10-CM | POA: Diagnosis not present

## 2023-02-22 DIAGNOSIS — J45909 Unspecified asthma, uncomplicated: Secondary | ICD-10-CM | POA: Diagnosis not present

## 2023-02-22 DIAGNOSIS — I10 Essential (primary) hypertension: Secondary | ICD-10-CM | POA: Diagnosis not present

## 2023-02-22 DIAGNOSIS — E782 Mixed hyperlipidemia: Secondary | ICD-10-CM | POA: Diagnosis not present

## 2023-02-22 DIAGNOSIS — G252 Other specified forms of tremor: Secondary | ICD-10-CM | POA: Diagnosis not present

## 2023-02-22 DIAGNOSIS — F418 Other specified anxiety disorders: Secondary | ICD-10-CM | POA: Diagnosis not present

## 2023-02-22 DIAGNOSIS — R7303 Prediabetes: Secondary | ICD-10-CM | POA: Diagnosis not present

## 2023-02-22 DIAGNOSIS — G43909 Migraine, unspecified, not intractable, without status migrainosus: Secondary | ICD-10-CM | POA: Diagnosis not present

## 2023-02-22 DIAGNOSIS — K219 Gastro-esophageal reflux disease without esophagitis: Secondary | ICD-10-CM | POA: Diagnosis not present

## 2023-04-12 DIAGNOSIS — H524 Presbyopia: Secondary | ICD-10-CM | POA: Diagnosis not present

## 2023-04-12 DIAGNOSIS — Z0100 Encounter for examination of eyes and vision without abnormal findings: Secondary | ICD-10-CM | POA: Diagnosis not present

## 2023-05-31 DIAGNOSIS — G252 Other specified forms of tremor: Secondary | ICD-10-CM | POA: Diagnosis not present

## 2023-05-31 DIAGNOSIS — Z79899 Other long term (current) drug therapy: Secondary | ICD-10-CM | POA: Diagnosis not present

## 2023-05-31 DIAGNOSIS — F418 Other specified anxiety disorders: Secondary | ICD-10-CM | POA: Diagnosis not present

## 2023-05-31 DIAGNOSIS — M79671 Pain in right foot: Secondary | ICD-10-CM | POA: Diagnosis not present

## 2023-05-31 DIAGNOSIS — M79672 Pain in left foot: Secondary | ICD-10-CM | POA: Diagnosis not present

## 2023-07-08 ENCOUNTER — Telehealth: Payer: Self-pay | Admitting: Neurology

## 2023-07-08 ENCOUNTER — Ambulatory Visit: Admitting: Neurology

## 2023-07-08 NOTE — Telephone Encounter (Signed)
 request to cancel appointment, pt sick , she has r/s

## 2023-08-14 DIAGNOSIS — I10 Essential (primary) hypertension: Secondary | ICD-10-CM | POA: Diagnosis not present

## 2023-08-14 DIAGNOSIS — R7303 Prediabetes: Secondary | ICD-10-CM | POA: Diagnosis not present

## 2023-08-14 DIAGNOSIS — K219 Gastro-esophageal reflux disease without esophagitis: Secondary | ICD-10-CM | POA: Diagnosis not present

## 2023-08-20 DIAGNOSIS — R7303 Prediabetes: Secondary | ICD-10-CM | POA: Diagnosis not present

## 2023-08-20 DIAGNOSIS — E781 Pure hyperglyceridemia: Secondary | ICD-10-CM | POA: Diagnosis not present

## 2023-08-20 DIAGNOSIS — R748 Abnormal levels of other serum enzymes: Secondary | ICD-10-CM | POA: Diagnosis not present

## 2023-08-20 DIAGNOSIS — I10 Essential (primary) hypertension: Secondary | ICD-10-CM | POA: Diagnosis not present

## 2023-08-20 DIAGNOSIS — R944 Abnormal results of kidney function studies: Secondary | ICD-10-CM | POA: Diagnosis not present

## 2023-08-20 DIAGNOSIS — K219 Gastro-esophageal reflux disease without esophagitis: Secondary | ICD-10-CM | POA: Diagnosis not present

## 2023-08-20 DIAGNOSIS — E782 Mixed hyperlipidemia: Secondary | ICD-10-CM | POA: Diagnosis not present

## 2023-08-20 DIAGNOSIS — G252 Other specified forms of tremor: Secondary | ICD-10-CM | POA: Diagnosis not present

## 2023-08-20 DIAGNOSIS — F418 Other specified anxiety disorders: Secondary | ICD-10-CM | POA: Diagnosis not present

## 2023-08-27 ENCOUNTER — Ambulatory Visit (INDEPENDENT_AMBULATORY_CARE_PROVIDER_SITE_OTHER): Admitting: Neurology

## 2023-08-27 ENCOUNTER — Encounter: Payer: Self-pay | Admitting: Neurology

## 2023-08-27 VITALS — BP 136/84 | Ht 59.0 in | Wt 212.0 lb

## 2023-08-27 DIAGNOSIS — G250 Essential tremor: Secondary | ICD-10-CM | POA: Diagnosis not present
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
## 2023-08-28 ENCOUNTER — Ambulatory Visit: Payer: Self-pay | Admitting: Neurology

## 2023-08-28 LAB — THYROID PANEL WITH TSH
Free Thyroxine Index: 2.6 (ref 1.2–4.9)
T3 Uptake Ratio: 27 % (ref 24–39)
T4, Total: 9.5 ug/dL (ref 4.5–12.0)
TSH: 4.47 u[IU]/mL (ref 0.450–4.500)

## 2023-12-10 DIAGNOSIS — R7303 Prediabetes: Secondary | ICD-10-CM | POA: Diagnosis not present

## 2023-12-10 DIAGNOSIS — K219 Gastro-esophageal reflux disease without esophagitis: Secondary | ICD-10-CM | POA: Diagnosis not present
